# Patient Record
Sex: Female | Born: 1975
Health system: Southern US, Community
[De-identification: ages and names within clinical notes are randomized; demographics above are authoritative.]

## PROBLEM LIST (undated history)

## (undated) DIAGNOSIS — F17200 Nicotine dependence, unspecified, uncomplicated: Secondary | ICD-10-CM

## (undated) HISTORY — DX: Nicotine dependence, unspecified, uncomplicated: F17.200

---

## 1998-06-27 ENCOUNTER — Other Ambulatory Visit: Admission: RE | Admit: 1998-06-27 | Discharge: 1998-06-27 | Payer: Self-pay | Admitting: Obstetrics and Gynecology

## 1999-07-03 ENCOUNTER — Other Ambulatory Visit: Admission: RE | Admit: 1999-07-03 | Discharge: 1999-07-03 | Payer: Self-pay | Admitting: Gynecology

## 2000-03-26 ENCOUNTER — Encounter: Admission: RE | Admit: 2000-03-26 | Discharge: 2000-06-24 | Payer: Self-pay | Admitting: Gynecology

## 2000-04-22 ENCOUNTER — Ambulatory Visit (HOSPITAL_COMMUNITY): Admission: RE | Admit: 2000-04-22 | Discharge: 2000-04-22 | Payer: Self-pay | Admitting: *Deleted

## 2000-04-24 ENCOUNTER — Inpatient Hospital Stay (HOSPITAL_COMMUNITY): Admission: AD | Admit: 2000-04-24 | Discharge: 2000-04-27 | Payer: Self-pay | Admitting: *Deleted

## 2000-06-04 ENCOUNTER — Other Ambulatory Visit: Admission: RE | Admit: 2000-06-04 | Discharge: 2000-06-04 | Payer: Self-pay | Admitting: *Deleted

## 2001-06-09 ENCOUNTER — Other Ambulatory Visit: Admission: RE | Admit: 2001-06-09 | Discharge: 2001-06-09 | Payer: Self-pay | Admitting: *Deleted

## 2002-06-22 ENCOUNTER — Other Ambulatory Visit: Admission: RE | Admit: 2002-06-22 | Discharge: 2002-06-22 | Payer: Self-pay | Admitting: Gynecology

## 2003-07-07 ENCOUNTER — Other Ambulatory Visit: Admission: RE | Admit: 2003-07-07 | Discharge: 2003-07-07 | Payer: Self-pay | Admitting: Gynecology

## 2004-07-17 ENCOUNTER — Other Ambulatory Visit: Admission: RE | Admit: 2004-07-17 | Discharge: 2004-07-17 | Payer: Self-pay | Admitting: Gynecology

## 2005-08-20 ENCOUNTER — Other Ambulatory Visit: Admission: RE | Admit: 2005-08-20 | Discharge: 2005-08-20 | Payer: Self-pay | Admitting: Gynecology

## 2006-08-26 ENCOUNTER — Other Ambulatory Visit: Admission: RE | Admit: 2006-08-26 | Discharge: 2006-08-26 | Payer: Self-pay | Admitting: Gynecology

## 2007-02-10 ENCOUNTER — Encounter: Admission: RE | Admit: 2007-02-10 | Discharge: 2007-02-10 | Payer: Self-pay | Admitting: Endocrinology

## 2007-03-06 ENCOUNTER — Encounter (INDEPENDENT_AMBULATORY_CARE_PROVIDER_SITE_OTHER): Payer: Self-pay | Admitting: Interventional Radiology

## 2007-03-06 ENCOUNTER — Other Ambulatory Visit: Admission: RE | Admit: 2007-03-06 | Discharge: 2007-03-06 | Payer: Self-pay | Admitting: Interventional Radiology

## 2007-03-06 ENCOUNTER — Encounter: Admission: RE | Admit: 2007-03-06 | Discharge: 2007-03-06 | Payer: Self-pay | Admitting: Endocrinology

## 2007-09-01 ENCOUNTER — Other Ambulatory Visit: Admission: RE | Admit: 2007-09-01 | Discharge: 2007-09-01 | Payer: Self-pay | Admitting: Gynecology

## 2008-09-06 ENCOUNTER — Ambulatory Visit: Payer: Self-pay | Admitting: Women's Health

## 2008-09-06 ENCOUNTER — Other Ambulatory Visit: Admission: RE | Admit: 2008-09-06 | Discharge: 2008-09-06 | Payer: Self-pay | Admitting: Gynecology

## 2008-09-06 ENCOUNTER — Encounter: Payer: Self-pay | Admitting: Women's Health

## 2008-09-20 ENCOUNTER — Ambulatory Visit: Payer: Self-pay | Admitting: Gynecology

## 2008-10-25 ENCOUNTER — Ambulatory Visit: Payer: Self-pay | Admitting: Gynecology

## 2009-09-26 ENCOUNTER — Ambulatory Visit: Payer: Self-pay | Admitting: Women's Health

## 2009-09-26 ENCOUNTER — Other Ambulatory Visit: Admission: RE | Admit: 2009-09-26 | Discharge: 2009-09-26 | Payer: Self-pay | Admitting: Gynecology

## 2010-05-26 NOTE — Discharge Summary (Signed)
[facility]  Patient:    Jaime Klein, Jaime Klein                        MRN: [id number] Adm. Date:  [id number] Disc. Date: [id number] Attending:  Merrily Pew Dictator:   Antony Contras, N.P.                           Discharge Summary  DISCHARGE DIAGNOSES:          Intrauterine pregnancy at 37 weeks, gestational diabetes requiring insulin management.  PROCEDURE:                    Normal spontaneous vaginal delivery of viable infant over intact perineum with repair of bilateral labial tears.  HISTORY OF PRESENT ILLNESS:   Patient is a 35 year old prima gravida with an LMP of August 03, 1999, [facility] of May 10, 2000.  Prenatal risk factors include a negative rubella titer.  Patient also has gestational diabetes which required insulin management 8 units of insulin at bedtime.  PRENATAL LABORATORIES:        Blood type O+.  Antibody screen negative.  RPR, HBSAG, HIV nonreactive.  Rubella non-immune.  MSAFP normal.  HOSPITAL COURSE:              Patient was admitted for induction on April 25, 2000 at 37 weeks after an amniocentesis for lung maturity showed a positive PG.  Induction was initiated with Cytotec followed by Pitocin.  Patient also had a positive GBS culture and was given penicillin protocol.  Cervix on induction was 1-2 cm, 40%, -1/2 station.  Artificial rupture of membranes revealed clear fluid.  Patient progressed to complete dilatation and delivered an Apgar 9/9 8 pound 2 ounce female infant over intact perineum.  She did sustain bilateral labial tears which were repaired.  Postpartum she remained afebrile.  She had no difficulty voiding and was able to be discharged on her second postpartum day in satisfactory condition.  LABORATORIES:                 CBC:  Hematocrit 32.3, hemoglobin 11.2, WBC 14.2, platelets 264,000.  DISPOSITION:                  She was given rubella vaccine prior to discharge.  She was to continue with prenatal vitamins,  Motrin and Tylox for pain.  Follow-up in the office in six weeks for postpartum. DD:  05/20/00 TD:  05/20/00 Job: [id number]

## 2010-05-26 NOTE — H&P (Signed)
[facility]  Patient:    Jaime Klein, Jaime Klein                        MRN: [id number] Adm. Date:  [id number] Attending:  Merrily Pew                         History and Physical  CHIEF COMPLAINT:  A 37-week intrauterine pregnancy with mature amniocentesis.  HISTORY OF PRESENT ILLNESS:  The patient is a 35 year old GENERAL: at 44 and 3/7th weeks gestation who is induced for having Class A2 diabetes and a mature amniocentesis for lung maturity. The patient was diagnosed with diabetes during her pregnancy and started on 8 units of insulin at bedtime to control her sugars which have been well controlled. The patient has been getting weekly fluid checks and biweekly stress tests which have all been normal. The patient underwent an ultrasound for fetal growth at 37 weeks and noted to be in the 98th percentile and underwent an amniocentesis for lung maturity which was noted to have a positive PG. The patient was then induced with Cytotec and will have rupture of membranes with Pitocin.  PAST MEDICAL HISTORY:  None.  PAST SURGICAL HISTORY:  Wisdom teeth.  PAST OBSTETRICAL HISTORY:  None.  PAST GYN HISTORY:  The patient has had no abnormal Pap smears or STDs.  CURRENT MEDICATIONS: 1. Prenatal vitamins. 2. Insulin 8 units NPH at bedtime.  ALLERGIES:  SULFA.  SOCIAL HISTORY:  Denies tobacco, alcohol or drugs.  FAMILY HISTORY:  Without any epithelial cancers.  PRENATAL LABORATORY AND ACCESSORY DATA:  O positive, Rubella nonimmune, GBS positive.  PHYSICAL EXAMINATION:  VITAL SIGNS:  Blood pressure 122/70.  LUNGS:  Clear to auscultation bilaterally.  CARDIOVASCULAR:  Regular rate and rhythm.  ABDOMEN:  Gravid and nontender. Estimated fetal weight 7 pounds, 8 ounces.  EXTREMITIES:  Without cyanosis, clubbing, edema or tenderness.  PELVIC:  Cervical exam fingertip, thick and -2.  ASSESSMENT AND PLAN:  35 year old gravida 1 at  37-1/2th weeks with insulin dependent diabetes during pregnancy and mature amniocentesis. Induction with Cytotec and Pitocin. Patient will be started on penicillin for positive GBS and have Accu-Cheks on her sugars every 2 hours. DD:  04/25/00 TD:  04/25/00 Job: [id number]

## 2010-09-25 DIAGNOSIS — F17200 Nicotine dependence, unspecified, uncomplicated: Secondary | ICD-10-CM | POA: Insufficient documentation

## 2010-09-25 DIAGNOSIS — Z975 Presence of (intrauterine) contraceptive device: Secondary | ICD-10-CM | POA: Insufficient documentation

## 2010-10-02 ENCOUNTER — Encounter: Payer: Self-pay | Admitting: Women's Health

## 2010-10-09 ENCOUNTER — Other Ambulatory Visit (HOSPITAL_COMMUNITY)
Admission: RE | Admit: 2010-10-09 | Discharge: 2010-10-09 | Disposition: A | Discharge status: Home / Self Care | Payer: 59 | Source: Ambulatory Visit | Attending: Women's Health | Admitting: Women's Health

## 2010-10-09 ENCOUNTER — Ambulatory Visit (INDEPENDENT_AMBULATORY_CARE_PROVIDER_SITE_OTHER): Payer: 59 | Admitting: Women's Health

## 2010-10-09 ENCOUNTER — Encounter: Payer: Self-pay | Admitting: Women's Health

## 2010-10-09 VITALS — BP 130/70 | Ht 66.5 in | Wt 167.0 lb

## 2010-10-09 DIAGNOSIS — Z01419 Encounter for gynecological examination (general) (routine) without abnormal findings: Secondary | ICD-10-CM

## 2010-10-09 DIAGNOSIS — Z833 Family history of diabetes mellitus: Secondary | ICD-10-CM

## 2010-10-09 DIAGNOSIS — F329 Major depressive disorder, single episode, unspecified: Secondary | ICD-10-CM

## 2010-10-09 DIAGNOSIS — Z13220 Encounter for screening for lipoid disorders: Secondary | ICD-10-CM

## 2010-10-09 DIAGNOSIS — R823 Hemoglobinuria: Secondary | ICD-10-CM

## 2010-10-09 MED ORDER — FLUOXETINE HCL 20 MG PO CAPS
20.0000 mg | ORAL_CAPSULE | Freq: Every day | ORAL | Status: DC
Start: 2010-10-09 — End: 2011-10-31

## 2010-10-09 NOTE — Progress Notes (Signed)
Jaime Klein 1975-06-10 [id number]    History:    The patient presents for annual exam.  Roma Kayser, has a 35 year old Mammie Lorenzo, 62 year old adopted niece Ladona Ridgel, 53 old son Aggie Hacker. Also has a 42 year old stepson but does not live with her that is causing numerous problems i.e. drugs and arrests.   Past medical history, past surgical history, family history and social history were all reviewed and documented in the EPIC chart.   ROS:  A  ROS was performed and pertinent positives and negatives are included in the history.  Exam:  Filed Vitals:   10/09/10 1132  BP: 130/70    General appearance:  Normal Head/Neck:  Normal, without cervical or supraclavicular adenopathy. Thyroid:  Symmetrical, normal in size, without palpable masses or nodularity. Respiratory  Effort:  Normal  Auscultation:  Clear without wheezing or rhonchi Cardiovascular  Auscultation:  Regular rate, without rubs, murmurs or gallops  Edema/varicosities:  Not grossly evident Abdominal  Soft,nontender, without masses, guarding or rebound.  Liver/spleen:  No organomegaly noted  Hernia:  None appreciated  Skin  Inspection:  Grossly normal  Palpation:  Grossly normal Neurologic/psychiatric  Orientation:  Normal with appropriate conversation.  Mood/affect:  Normal  Genitourinary    Breasts: Examined lying and sitting.     Right: Without masses, retractions, discharge or axillary adenopathy.     Left: Without masses, retractions, discharge or axillary adenopathy.   Inguinal/mons:  Normal without inguinal adenopathy  External genitalia:  Normal  BUS/Urethra/Skene's glands:  Normal  Bladder:  Normal  Vagina:  Normal  Cervix:  Normal  Uterus:   normal in size, shape and contour.  Midline and mobile  Adnexa/parametria:     Rt: Without masses or tenderness.   Lt: Without masses or tenderness.  Anus and perineum: Normal  Digital rectal exam: Normal sphincter tone without palpated masses or  tenderness  Assessment/Plan:  35 y.o. [id number]  for annual exam Mirena IUD placed 9/10 with amenorrhea. Strings not visualized, ultrasound was done last year that  confirmed IUD in the uterus. No complaints. History of GDM.  Normal GYN exam Mirena IUD with amenorrhea  Plan: SBEs, exercise, calcium rich diet encouraged. CBC, glucose, lipid profile UA and Pap. Did review importance of no smoking, she quit in 2005, and states smokes a cigarette occasionally socially.   Harrington Challenger [facility], 12:56 PM 10/09/2010 .

## 2011-10-31 ENCOUNTER — Other Ambulatory Visit: Payer: Self-pay | Admitting: Women's Health

## 2011-11-05 ENCOUNTER — Encounter: Payer: Self-pay | Admitting: Women's Health

## 2011-11-05 ENCOUNTER — Ambulatory Visit (INDEPENDENT_AMBULATORY_CARE_PROVIDER_SITE_OTHER): Payer: 59 | Admitting: Women's Health

## 2011-11-05 VITALS — BP 112/76 | Ht 66.5 in | Wt 176.0 lb

## 2011-11-05 DIAGNOSIS — Z13220 Encounter for screening for lipoid disorders: Secondary | ICD-10-CM

## 2011-11-05 DIAGNOSIS — F419 Anxiety disorder, unspecified: Secondary | ICD-10-CM

## 2011-11-05 DIAGNOSIS — Z01419 Encounter for gynecological examination (general) (routine) without abnormal findings: Secondary | ICD-10-CM

## 2011-11-05 DIAGNOSIS — F411 Generalized anxiety disorder: Secondary | ICD-10-CM

## 2011-11-05 DIAGNOSIS — Z833 Family history of diabetes mellitus: Secondary | ICD-10-CM

## 2011-11-05 MED ORDER — FLUOXETINE HCL 20 MG PO CAPS: 20.0000 mg | ORAL_CAPSULE | Freq: Every day | ORAL | Status: DC

## 2011-11-05 NOTE — Progress Notes (Signed)
Jaime Klein 1975-08-28 [id number]    History:    The patient presents for annual exam.  Mirena IUD 09/2008 with rare bleeding. Smokes less than one half pack per day, Prozac 20 mg daily. History of normal Paps. Had labs taken at primary care, reports blood sugar was borderline.   Past medical history, past surgical history, family history and social history were all reviewed and documented in the EPIC chart. History of GDM. 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   ROS:  A  ROS was performed and pertinent positives and negatives are included in the history.  Exam:  Filed Vitals:   11/05/11 0944  BP: 112/76    General appearance:  Normal Head/Neck:  Normal, without cervical or supraclavicular adenopathy. Thyroid:  Symmetrical, normal in size, without palpable masses or nodularity. Respiratory  Effort:  Normal  Auscultation:  Clear without wheezing or rhonchi Cardiovascular  Auscultation:  Regular rate, without rubs, murmurs or gallops  Edema/varicosities:  Not grossly evident Abdominal  Soft,nontender, without masses, guarding or rebound.  Liver/spleen:  No organomegaly noted  Hernia:  None appreciated  Skin  Inspection:  Grossly normal  Palpation:  Grossly normal Neurologic/psychiatric  Orientation:  Normal with appropriate conversation.  Mood/affect:  Normal  Genitourinary    Breasts: Examined lying and sitting.     Right: Without masses, retractions, discharge or axillary adenopathy.     Left: Without masses, retractions, discharge or axillary adenopathy.   Inguinal/mons:  Normal without inguinal adenopathy  External genitalia:  Normal  BUS/Urethra/Skene's glands:  Normal  Bladder:  Normal  Vagina:  Normal  Cervix:  Normal IUD strings visible  Uterus:   normal in size, shape and contour.  Midline and mobile  Adnexa/parametria:     Rt: Without masses or tenderness.   Lt: Without masses or tenderness.  Anus and  perineum: Normal  Digital rectal exam: Normal sphincter tone without palpated masses or tenderness  Assessment/Plan:  36 y.o. M. WF G3 P1 for annual exam with no complaints.  Normal GYN exam Mirena IUD 09/2008 History of GDM with borderline blood sugar primary care 08/2011 Depression stable on Prozac 20 Smoker less than one half pack per day  Plan: Reviewed importance of continuing lifestyle changes of diet and exercise, followup with primary care as directed. SBE's, calcium rich diet, vitamin D 1000 daily, decreasing calories for weight loss encouraged. Normal Pap 2012, new screening guidelines reviewed. UA. Prozac 20 mg by mouth daily, prescription, proper use given and reviewed, counseling in the past and as needed. Requests to continue has done well on. Aware of hazards of smoking and plans to quit.   YOUNG,NANCY J WHNP, 1:20 PM 11/05/2011

## 2011-11-05 NOTE — Addendum Note (Signed)
Addended by: Harrington Challenger on: 11/05/2011 04:35 PM   Modules accepted: Level of Service

## 2011-11-05 NOTE — Patient Instructions (Signed)

## 2011-11-06 LAB — URINALYSIS W MICROSCOPIC + REFLEX CULTURE
Casts: NONE SEEN
Crystals: NONE SEEN
Ketones, ur: NEGATIVE mg/dL
Leukocytes, UA: NEGATIVE
Nitrite: NEGATIVE
Specific Gravity, Urine: 1.023 (ref 1.005–1.030)
Squamous Epithelial / LPF: NONE SEEN
pH: 6 (ref 5.0–8.0)

## 2011-12-11 ENCOUNTER — Telehealth: Payer: Self-pay | Admitting: *Deleted

## 2011-12-11 NOTE — Telephone Encounter (Signed)
[facility] called at [phone] requesting refill on prozac 20 mg cap. #30. rx called in .

## 2011-12-22 ENCOUNTER — Other Ambulatory Visit: Payer: Self-pay | Admitting: Women's Health

## 2012-02-23 ENCOUNTER — Other Ambulatory Visit: Payer: Self-pay

## 2012-11-10 ENCOUNTER — Encounter: Payer: 59 | Admitting: Women's Health

## 2012-11-12 ENCOUNTER — Encounter: Payer: Self-pay | Admitting: Women's Health

## 2012-11-13 ENCOUNTER — Other Ambulatory Visit: Payer: Self-pay

## 2012-12-03 ENCOUNTER — Encounter: Payer: Self-pay | Admitting: Women's Health

## 2012-12-25 ENCOUNTER — Encounter: Payer: Self-pay | Admitting: Women's Health

## 2013-01-06 ENCOUNTER — Other Ambulatory Visit: Payer: Self-pay | Admitting: Women's Health

## 2013-01-20 ENCOUNTER — Encounter: Payer: Self-pay | Admitting: Women's Health

## 2013-01-27 ENCOUNTER — Encounter: Payer: Self-pay | Admitting: Women's Health

## 2013-02-11 ENCOUNTER — Encounter: Payer: Self-pay | Admitting: Women's Health

## 2013-02-11 ENCOUNTER — Ambulatory Visit (INDEPENDENT_AMBULATORY_CARE_PROVIDER_SITE_OTHER): Payer: 59 | Admitting: Women's Health

## 2013-02-11 ENCOUNTER — Other Ambulatory Visit (HOSPITAL_COMMUNITY)
Admission: RE | Admit: 2013-02-11 | Discharge: 2013-02-11 | Disposition: A | Discharge status: Home / Self Care | Payer: 59 | Source: Ambulatory Visit | Attending: Gynecology | Admitting: Gynecology

## 2013-02-11 VITALS — BP 108/75 | Ht 66.0 in | Wt 178.0 lb

## 2013-02-11 DIAGNOSIS — Z01419 Encounter for gynecological examination (general) (routine) without abnormal findings: Secondary | ICD-10-CM

## 2013-02-11 DIAGNOSIS — F411 Generalized anxiety disorder: Secondary | ICD-10-CM

## 2013-02-11 MED ORDER — FLUOXETINE HCL 20 MG PO CAPS: ORAL_CAPSULE | ORAL | Status: DC

## 2013-02-11 NOTE — Addendum Note (Signed)
Addended by: Aura CampsWEBB, JENNIFER L on: 02/11/2013 11:28 AM   Modules accepted: Orders

## 2013-02-11 NOTE — Patient Instructions (Signed)

## 2013-02-11 NOTE — Progress Notes (Signed)
Jaime DadConnie Klein 08/11/1975 [id number]    History:    Presents for annual exam.  Amenorrheic with Mirena IUD placed 09/2008. Rare  one-day spotting. Normal Pap history. Smoker less than half pack per week. GDM with pregnancy. Doing well on Prozac 20 daily. Reports normal labs at primary care.  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 Mother hypertension. Jaime Klein.  ROS:  A  ROS was performed and pertinent positives and negatives are included.  Exam:  Filed Vitals:   02/11/13 1042  BP: 108/75    General appearance:  Normal Thyroid:  Symmetrical, normal in size, without palpable masses or nodularity. Respiratory  Auscultation:  Clear without wheezing or rhonchi Cardiovascular  Auscultation:  Regular rate, without rubs, murmurs or gallops  Edema/varicosities:  Not grossly evident Abdominal  Soft,nontender, without masses, guarding or rebound.  Liver/spleen:  No organomegaly noted  Hernia:  None appreciated  Skin  Inspection:  Grossly normal   Breasts: Examined lying and sitting.     Right: Without masses, retractions, discharge or axillary adenopathy.     Left: Without masses, retractions, discharge or axillary adenopathy. Gentitourinary   Inguinal/mons:  Normal without inguinal adenopathy  External genitalia:  Normal  BUS/Urethra/Skene's glands:  Normal  Vagina:  Normal  Cervix:  Normal, IUD string in os  Uterus: normal in size, shape and contour.  Midline and mobile  Adnexa/parametria:     Rt: Without masses or tenderness.   Lt: Without masses or tenderness.  Anus and perineum: Normal  Digital rectal exam: Normal sphincter tone without palpated masses or tenderness  Assessment/Plan:  38 y.o. MWF G1P1+1 adopted for annual exam with no complaints.  Mirena IUD 09/2008 Smoker Anxiety/depression stable on  Prozac   Plan: Schedule appointment August 2015 for Dr. Audie Klein to remove and replace Mirena IUD. SBE's, regular exercise, decrease calories for weight loss, calcium rich diet encouraged. Prozac 20 mg by mouth daily, prescription, proper use given and reviewed, denies need for counseling at this time. Aware of hazards of smoking is trying to completely quit. Pap, Pap normal 10/2010, new screening guidelines reviewed, HPV typing reviewed, declined. Keep scheduled followup with primary care for labs.   Jaime ChallengerYOUNG,Jaime Klein WHNP, 11:15 AM 02/11/2013

## 2013-10-16 ENCOUNTER — Other Ambulatory Visit: Payer: Self-pay | Admitting: Gynecology

## 2013-10-16 ENCOUNTER — Telehealth: Payer: Self-pay | Admitting: Gynecology

## 2013-10-16 DIAGNOSIS — Z30431 Encounter for routine checking of intrauterine contraceptive device: Secondary | ICD-10-CM

## 2013-10-16 MED ORDER — LEVONORGESTREL 20 MCG/24HR IU IUD: INTRAUTERINE_SYSTEM | Freq: Once | INTRAUTERINE | Status: AC

## 2013-10-16 NOTE — Telephone Encounter (Signed)
10/16/13-Pt [facility] removal of old Mirena and insertion of new at 100%, no copay. She already has appt scheduled with TF/wl

## 2013-11-09 ENCOUNTER — Encounter: Payer: Self-pay | Admitting: Gynecology

## 2013-11-09 ENCOUNTER — Ambulatory Visit (INDEPENDENT_AMBULATORY_CARE_PROVIDER_SITE_OTHER): Payer: 59 | Admitting: Gynecology

## 2013-11-09 DIAGNOSIS — Z30432 Encounter for removal of intrauterine contraceptive device: Secondary | ICD-10-CM

## 2013-11-09 DIAGNOSIS — Z30433 Encounter for removal and reinsertion of intrauterine contraceptive device: Secondary | ICD-10-CM

## 2013-11-09 DIAGNOSIS — Z30430 Encounter for insertion of intrauterine contraceptive device: Secondary | ICD-10-CM

## 2013-11-09 HISTORY — PX: INTRAUTERINE DEVICE INSERTION: SHX323

## 2013-11-09 NOTE — Patient Instructions (Signed)
Intrauterine Device Insertion Most often, an intrauterine device (IUD) is inserted into the uterus to prevent pregnancy. There are 2 types of IUDs available:  Copper IUD--This type of IUD creates an environment that is not favorable to sperm survival. The mechanism of action of the copper IUD is not known for certain. It can stay in place for 10 years.  Hormone IUD--This type of IUD contains the hormone progestin (synthetic progesterone). The progestin thickens the cervical mucus and prevents sperm from entering the uterus, and it also thins the uterine lining. There is no evidence that the hormone IUD prevents implantation. One hormone IUD can stay in place for up to 5 years, and a different hormone IUD can stay in place for up to 3 years. An IUD is the most cost-effective birth control if left in place for the full duration. It may be removed at any time. LET YOUR HEALTH CARE PROVIDER KNOW ABOUT:  Any allergies you have.  All medicines you are taking, including vitamins, herbs, eye drops, creams, and over-the-counter medicines.  Previous problems you or members of your family have had with the use of anesthetics.  Any blood disorders you have.  Previous surgeries you have had.  Possibility of pregnancy.  Medical conditions you have. RISKS AND COMPLICATIONS  Generally, intrauterine device insertion is a safe procedure. However, as with any procedure, complications can occur. Possible complications include:  Accidental puncture (perforation) of the uterus.  Accidental placement of the IUD either in the muscle layer of the uterus (myometrium) or outside the uterus. If this happens, the IUD can be found essentially floating around the bowels and must be taken out surgically.  The IUD may fall out of the uterus (expulsion). This is more common in women who have recently had a child.   Pregnancy in the fallopian tube (ectopic).  Pelvic inflammatory disease (PID), which is infection of  the uterus and fallopian tubes. The risk of PID is slightly increased in the first 20 days after the IUD is placed, but the overall risk is still very low. BEFORE THE PROCEDURE  Schedule the IUD insertion for when you will have your menstrual period or right after, to make sure you are not pregnant. Placement of the IUD is better tolerated shortly after a menstrual cycle.  You may need to take tests or be examined to make sure you are not pregnant.  You may be required to take a pregnancy test.  You may be required to get checked for sexually transmitted infections (STIs) prior to placement. Placing an IUD in someone who has an infection can make the infection worse.  You may be given a pain reliever to take 1 or 2 hours before the procedure.  An exam will be performed to determine the size and position of your uterus.  Ask your health care provider about changing or stopping your regular medicines. PROCEDURE   A tool (speculum) is placed in the vagina. This allows your health care provider to see the lower part of the uterus (cervix).  The cervix is prepped with a medicine that lowers the risk of infection.  You may be given a medicine to numb each side of the cervix (intracervical or paracervical block). This is used to block and control any discomfort with insertion.  A tool (uterine sound) is inserted into the uterus to determine the length of the uterine cavity and the direction the uterus may be tilted.  A slim instrument (IUD inserter) is inserted through the cervical   canal and into your uterus.  The IUD is placed in the uterine cavity and the insertion device is removed.  The nylon string that is attached to the IUD and used for eventual IUD removal is trimmed. It is trimmed so that it lays high in the vagina, just outside the cervix. AFTER THE PROCEDURE  You may have bleeding after the procedure. This is normal. It varies from light spotting for a few days to menstrual-like  bleeding.  You may have mild cramping. Document Released: 08/23/2010 Document Revised: 10/15/2012 Document Reviewed: 06/15/2012 ExitCare Patient Information 2015 [employer]. This information is not intended to replace advice given to you by your health care provider. Make sure you discuss any questions you have with your health care provider.  

## 2013-11-09 NOTE — Progress Notes (Signed)
Patient presents for Mirena IUD removal and replacement. Currently has a Mirena IUD at the five-year mark without complications and wants to have it replaced.  She is not having menses.  She has read through the booklet, has no contraindications and signed the consent form.  I reviewed the removal and insertional process with her as well as the risks to include infection, either immediate or long-term, uterine perforation or migration requiring surgery to remove, other complications such as pain, hormonal side effects and possibility of failure with subsequent pregnancy.   Exam with Kim assistant Pelvic: External BUS vagina normal. Cervix normal with IUD string visualized. Uterus anteverted the axial normal size shape contour midline mobile nontender. Adnexa without masses or tenderness.  Procedure: The cervix was visualized with a speculum and the IUD string was grasped with the [facility] forcep and the old Mirena IUD was removed, shown to the patient and discarded.  The cervix was then cleansed with Betadine, anterior lip grasped with a single-tooth tenaculum, the uterus was sounded and a Mirena IUD was placed according to manufacturer's recommendations without difficulty. The strings were trimmed. The patient tolerated well and will follow up in one month for a postinsertional check.  Lot number:  [id number]    Dara LordsFONTAINE,Ryshawn Sanzone P MD, 3:11 PM 11/09/2013

## 2013-11-10 ENCOUNTER — Encounter: Payer: Self-pay | Admitting: Gynecology

## 2013-12-07 ENCOUNTER — Ambulatory Visit (INDEPENDENT_AMBULATORY_CARE_PROVIDER_SITE_OTHER): Payer: 59 | Admitting: Gynecology

## 2013-12-07 ENCOUNTER — Encounter: Payer: Self-pay | Admitting: Gynecology

## 2013-12-07 DIAGNOSIS — Z30431 Encounter for routine checking of intrauterine contraceptive device: Secondary | ICD-10-CM

## 2013-12-07 NOTE — Patient Instructions (Signed)
Follow up in February 2016 for annual exam when you are due. Sooner if any issues.

## 2013-12-07 NOTE — Progress Notes (Signed)
Jaime DadConnie Klein 03/07/1975 [id number]        38 y.o.  G1P1 Presents for IUD checkup. Doing well without complaints.  Past medical history,surgical history, problem list, medications, allergies, family history and social history were all reviewed and documented in the EPIC chart.  Directed ROS with pertinent positives and negatives documented in the history of present illness/assessment and plan.  Exam: Kim assistant General appearance:  Normal External BUS vagina normal. Cervix normal with IUD string visualized in appropriate length. Uterus normal size midline mobile nontender. Adnexa without masses or tenderness.  Assessment/Plan:  38 y.o. G1P1 with normal IUD follow up exam. Follow up in February 2016 when due for annual exam, sooner if any issues.     Dara LordsFONTAINE,Kesley Gaffey P MD, 11:29 AM 12/07/2013

## 2014-02-16 ENCOUNTER — Other Ambulatory Visit: Payer: Self-pay | Admitting: Women's Health

## 2014-03-03 ENCOUNTER — Encounter: Payer: Self-pay | Admitting: Women's Health

## 2014-03-03 ENCOUNTER — Ambulatory Visit (INDEPENDENT_AMBULATORY_CARE_PROVIDER_SITE_OTHER): Payer: 59 | Admitting: Women's Health

## 2014-03-03 VITALS — BP 142/80 | Ht 67.0 in | Wt 190.0 lb

## 2014-03-03 DIAGNOSIS — Z01419 Encounter for gynecological examination (general) (routine) without abnormal findings: Secondary | ICD-10-CM

## 2014-03-03 DIAGNOSIS — Z13220 Encounter for screening for lipoid disorders: Secondary | ICD-10-CM

## 2014-03-03 DIAGNOSIS — N898 Other specified noninflammatory disorders of vagina: Secondary | ICD-10-CM

## 2014-03-03 DIAGNOSIS — Z975 Presence of (intrauterine) contraceptive device: Secondary | ICD-10-CM

## 2014-03-03 LAB — CBC WITH DIFFERENTIAL/PLATELET
BASOS ABS: 0 10*3/uL (ref 0.0–0.1)
BASOS PCT: 0 % (ref 0–1)
EOS ABS: 0.1 10*3/uL (ref 0.0–0.7)
Eosinophils Relative: 1 % (ref 0–5)
HEMATOCRIT: 43.3 % (ref 36.0–46.0)
HEMOGLOBIN: 14.9 g/dL (ref 12.0–15.0)
LYMPHS ABS: 2 10*3/uL (ref 0.7–4.0)
Lymphocytes Relative: 21 % (ref 12–46)
MCH: 30.4 pg (ref 26.0–34.0)
MCHC: 34.4 g/dL (ref 30.0–36.0)
MCV: 88.4 fL (ref 78.0–100.0)
MONO ABS: 0.8 10*3/uL (ref 0.1–1.0)
MONOS PCT: 8 % (ref 3–12)
MPV: 8.8 fL (ref 8.6–12.4)
Neutro Abs: 6.8 10*3/uL (ref 1.7–7.7)
Neutrophils Relative %: 70 % (ref 43–77)
Platelets: 334 10*3/uL (ref 150–400)
RBC: 4.9 MIL/uL (ref 3.87–5.11)
RDW: 13.6 % (ref 11.5–15.5)
WBC: 9.7 10*3/uL (ref 4.0–10.5)

## 2014-03-03 LAB — COMPREHENSIVE METABOLIC PANEL
ALBUMIN: 4.6 g/dL (ref 3.5–5.2)
ALK PHOS: 60 U/L (ref 39–117)
ALT: 13 U/L (ref 0–35)
AST: 13 U/L (ref 0–37)
BILIRUBIN TOTAL: 0.6 mg/dL (ref 0.2–1.2)
BUN: 10 mg/dL (ref 6–23)
CALCIUM: 9.4 mg/dL (ref 8.4–10.5)
CO2: 26 meq/L (ref 19–32)
Chloride: 102 mEq/L (ref 96–112)
Creat: 0.74 mg/dL (ref 0.50–1.10)
Glucose, Bld: 87 mg/dL (ref 70–99)
Potassium: 4.8 mEq/L (ref 3.5–5.3)
SODIUM: 138 meq/L (ref 135–145)
TOTAL PROTEIN: 7.6 g/dL (ref 6.0–8.3)

## 2014-03-03 LAB — LIPID PANEL
CHOLESTEROL: 160 mg/dL (ref 0–200)
HDL: 72 mg/dL (ref >=46)
LDL Cholesterol: 64 mg/dL (ref 0–99)
TRIGLYCERIDES: 120 mg/dL (ref <150)
Total CHOL/HDL Ratio: 2.2 Ratio
VLDL: 24 mg/dL (ref 0–40)

## 2014-03-03 LAB — WET PREP FOR TRICH, YEAST, CLUE
CLUE CELLS WET PREP: NONE SEEN
Trich, Wet Prep: NONE SEEN

## 2014-03-03 MED ORDER — FLUOXETINE HCL 20 MG PO CAPS: ORAL_CAPSULE | ORAL | Status: DC

## 2014-03-03 MED ORDER — FLUCONAZOLE 150 MG PO TABS: 150.0000 mg | ORAL_TABLET | Freq: Once | ORAL | Status: DC

## 2014-03-03 NOTE — Progress Notes (Addendum)
Jaime Klein 01/07/1976 [id number]    History:    Presents for annual exam.  Amenorrheic second Mirena IUD placed 11/2013. History of GDM. Smoker less than 5 cigarettes daily. Mother hypertension. Anxiety/depression stable on Prozac. Normal Pap history.  Past medical history, past surgical history, family history and social history were all reviewed and documented in the EPIC chart. 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 Husband farmer. Mother hypertension.  ROS:  A ROS was performed and pertinent positives and negatives are included.  Exam:  Filed Vitals:   03/03/14 1146  BP: 142/80    General appearance:  Normal Thyroid:  Symmetrical, normal in size, without palpable masses or nodularity. Respiratory  Auscultation:  Clear without wheezing or rhonchi Cardiovascular  Auscultation:  Regular rate, without rubs, murmurs or gallops  Edema/varicosities:  Not grossly evident Abdominal  Soft,nontender, without masses, guarding or rebound.  Liver/spleen:  No organomegaly noted  Hernia:  None appreciated  Skin  Inspection:  Grossly normal   Breasts: Examined lying and sitting.     Right: Without masses, retractions, discharge or axillary adenopathy.     Left: Without masses, retractions, discharge or axillary adenopathy. Gentitourinary   Inguinal/mons:  Normal without inguinal adenopathy  External genitalia:  Normal  BUS/Urethra/Skene's glands:  Normal  Vagina:  Minimal white discharge, wet prep positive for yeast  Cervix:  Normal IUD strings visible  Uterus:   normal in size, shape and contour.  Midline and mobile  Adnexa/parametria:     Rt: Without masses or tenderness.   Lt: Without masses or tenderness.  Anus and perineum: Normal  Digital rectal exam: Normal sphincter tone without palpated masses or tenderness  Assessment/Plan:  39 y.o. MWF G1P1 + 1 adopted for annual exam with complaint of mild vaginal itching.     Amenorrhea Mirena IUD 11/2013 Obesity Smoker Anxiety/depression stable on Prozac Yeast vaginitis  Plan: Reviewed importance of increasing regular exercise, decreasing calories for weight loss, has gained 12 pounds in the past year. SBE's, calcium rich diet, vitamin D 1000 daily encouraged. CBC, CMP, lipid panel, TSH, UA, Pap normal 2015, new screening guidelines reviewed. Prozac 20 mg by mouth daily prescription, proper use given and reviewed importance of leisure activities, exercise. Denies need for counseling at this time. Aware of hazards of smoking. Reviewed blood pressure slightly elevated instructed to check away from office if continues greater than 130/80 to follow-up with primary care. Diflucan 150 by mouth 1 dose, prescription, proper use given and reviewed instructed to call if no relief of symptoms.     Harrington ChallengerYOUNG,Delonta Yohannes J WHNP, 1:01 PM 03/03/2014

## 2014-03-03 NOTE — Patient Instructions (Signed)
Health Maintenance Adopting a healthy lifestyle and getting preventive care can go a long way to promote health and wellness. Talk with your health care provider about what schedule of regular examinations is right for you. This is a good chance for you to check in with your provider about disease prevention and staying healthy. In between checkups, there are plenty of things you can do on your own. Experts have done a lot of research about which lifestyle changes and preventive measures are most likely to keep you healthy. Ask your health care provider for more information. WEIGHT AND DIET  Eat a healthy diet  Be sure to include plenty of vegetables, fruits, low-fat dairy products, and lean protein.  Do not eat a lot of foods high in solid fats, added sugars, or salt.  Get regular exercise. This is one of the most important things you can do for your health.  Most adults should exercise for at least 150 minutes each week. The exercise should increase your heart rate and make you sweat (moderate-intensity exercise).  Most adults should also do strengthening exercises at least twice a week. This is in addition to the moderate-intensity exercise.  Maintain a healthy weight  Body mass index (BMI) is a measurement that can be used to identify possible weight problems. It estimates body fat based on height and weight. Your health care provider can help determine your BMI and help you achieve or maintain a healthy weight.  For females 25 years of age and older:   A BMI below 18.5 is considered underweight.  A BMI of 18.5 to 24.9 is normal.  A BMI of 25 to 29.9 is considered overweight.  A BMI of 30 and above is considered obese.  Watch levels of cholesterol and blood lipids  You should start having your blood tested for lipids and cholesterol at 39 years of age, then have this test every 5 years.  You may need to have your cholesterol levels checked more often if:  Your lipid or  cholesterol levels are high.  You are older than 39 years of age.  You are at high risk for heart disease.  CANCER SCREENING   Lung Cancer  Lung cancer screening is recommended for adults 97-92 years old who are at high risk for lung cancer because of a history of smoking.  A yearly low-dose CT scan of the lungs is recommended for people who:  Currently smoke.  Have quit within the past 15 years.  Have at least a 30-pack-year history of smoking. A pack year is smoking an average of one pack of cigarettes a day for 1 year.  Yearly screening should continue until it has been 15 years since you quit.  Yearly screening should stop if you develop a health problem that would prevent you from having lung cancer treatment.  Breast Cancer  Practice breast self-awareness. This means understanding how your breasts normally appear and feel.  It also means doing regular breast self-exams. Let your health care provider know about any changes, no matter how small.  If you are in your 20s or 30s, you should have a clinical breast exam (CBE) by a health care provider every 1-3 years as part of a regular health exam.  If you are 76 or older, have a CBE every year. Also consider having a breast X-ray (mammogram) every year.  If you have a family history of breast cancer, talk to your health care provider about genetic screening.  If you are  at high risk for breast cancer, talk to your health care provider about having an MRI and a mammogram every year.  Breast cancer gene (BRCA) assessment is recommended for women who have family members with BRCA-related cancers. BRCA-related cancers include:  Breast.  Ovarian.  Tubal.  Peritoneal cancers.  Results of the assessment will determine the need for genetic counseling and BRCA1 and BRCA2 testing. Cervical Cancer Routine pelvic examinations to screen for cervical cancer are no longer recommended for nonpregnant women who are considered low  risk for cancer of the pelvic organs (ovaries, uterus, and vagina) and who do not have symptoms. A pelvic examination may be necessary if you have symptoms including those associated with pelvic infections. Ask your health care provider if a screening pelvic exam is right for you.   The Pap test is the screening test for cervical cancer for women who are considered at risk.  If you had a hysterectomy for a problem that was not cancer or a condition that could lead to cancer, then you no longer need Pap tests.  If you are older than 65 years, and you have had normal Pap tests for the past 10 years, you no longer need to have Pap tests.  If you have had past treatment for cervical cancer or a condition that could lead to cancer, you need Pap tests and screening for cancer for at least 20 years after your treatment.  If you no longer get a Pap test, assess your risk factors if they change (such as having a new sexual partner). This can affect whether you should start being screened again.  Some women have medical problems that increase their chance of getting cervical cancer. If this is the case for you, your health care provider may recommend more frequent screening and Pap tests.  The human papillomavirus (HPV) test is another test that may be used for cervical cancer screening. The HPV test looks for the virus that can cause cell changes in the cervix. The cells collected during the Pap test can be tested for HPV.  The HPV test can be used to screen women 30 years of age and older. Getting tested for HPV can extend the interval between normal Pap tests from three to five years.  An HPV test also should be used to screen women of any age who have unclear Pap test results.  After 39 years of age, women should have HPV testing as often as Pap tests.  Colorectal Cancer  This type of cancer can be detected and often prevented.  Routine colorectal cancer screening usually begins at 39 years of  age and continues through 39 years of age.  Your health care provider may recommend screening at an earlier age if you have risk factors for colon cancer.  Your health care provider may also recommend using home test kits to check for hidden blood in the stool.  A small camera at the end of a tube can be used to examine your colon directly (sigmoidoscopy or colonoscopy). This is done to check for the earliest forms of colorectal cancer.  Routine screening usually begins at age 50.  Direct examination of the colon should be repeated every 5-10 years through 39 years of age. However, you may need to be screened more often if early forms of precancerous polyps or small growths are found. Skin Cancer  Check your skin from head to toe regularly.  Tell your health care provider about any new moles or changes in   moles, especially if there is a change in a mole's shape or color.  Also tell your health care provider if you have a mole that is larger than the size of a pencil eraser.  Always use sunscreen. Apply sunscreen liberally and repeatedly throughout the day.  Protect yourself by wearing long sleeves, pants, a wide-brimmed hat, and sunglasses whenever you are outside. HEART DISEASE, DIABETES, AND HIGH BLOOD PRESSURE   Have your blood pressure checked at least every 1-2 years. High blood pressure causes heart disease and increases the risk of stroke.  If you are between 75 years and 42 years old, ask your health care provider if you should take aspirin to prevent strokes.  Have regular diabetes screenings. This involves taking a blood sample to check your fasting blood sugar level.  If you are at a normal weight and have a low risk for diabetes, have this test once every three years after 39 years of age.  If you are overweight and have a high risk for diabetes, consider being tested at a younger age or more often. PREVENTING INFECTION  Hepatitis B  If you have a higher risk for  hepatitis B, you should be screened for this virus. You are considered at high risk for hepatitis B if:  You were born in a country where hepatitis B is common. Ask your health care provider which countries are considered high risk.  Your parents were born in a high-risk country, and you have not been immunized against hepatitis B (hepatitis B vaccine).  You have HIV or AIDS.  You use needles to inject street drugs.  You live with someone who has hepatitis B.  You have had sex with someone who has hepatitis B.  You get hemodialysis treatment.  You take certain medicines for conditions, including cancer, organ transplantation, and autoimmune conditions. Hepatitis C  Blood testing is recommended for:  Everyone born from [birth year] through 1965.  Anyone with known risk factors for hepatitis C. Sexually transmitted infections (STIs)  You should be screened for sexually transmitted infections (STIs) including gonorrhea and chlamydia if:  You are sexually active and are younger than 39 years of age.  You are older than 39 years of age and your health care provider tells you that you are at risk for this type of infection.  Your sexual activity has changed since you were last screened and you are at an increased risk for chlamydia or gonorrhea. Ask your health care provider if you are at risk.  If you do not have HIV, but are at risk, it may be recommended that you take a prescription medicine daily to prevent HIV infection. This is called pre-exposure prophylaxis (PrEP). You are considered at risk if:  You are sexually active and do not regularly use condoms or know the HIV status of your partner(s).  You take drugs by injection.  You are sexually active with a partner who has HIV. Talk with your health care provider about whether you are at high risk of being infected with HIV. If you choose to begin PrEP, you should first be tested for HIV. You should then be tested every 3 months for  as long as you are taking PrEP.  PREGNANCY   If you are premenopausal and you may become pregnant, ask your health care provider about preconception counseling.  If you may become pregnant, take 400 to 800 micrograms (mcg) of folic acid every day.  If you want to prevent pregnancy, talk to your  health care provider about birth control (contraception). OSTEOPOROSIS AND MENOPAUSE   Osteoporosis is a disease in which the bones lose minerals and strength with aging. This can result in serious bone fractures. Your risk for osteoporosis can be identified using a bone density scan.  If you are 65 years of age or older, or if you are at risk for osteoporosis and fractures, ask your health care provider if you should be screened.  Ask your health care provider whether you should take a calcium or vitamin D supplement to lower your risk for osteoporosis.  Menopause may have certain physical symptoms and risks.  Hormone replacement therapy may reduce some of these symptoms and risks. Talk to your health care provider about whether hormone replacement therapy is right for you.  HOME CARE INSTRUCTIONS   Schedule regular health, dental, and eye exams.  Stay current with your immunizations.   Do not use any tobacco products including cigarettes, chewing tobacco, or electronic cigarettes.  If you are pregnant, do not drink alcohol.  If you are breastfeeding, limit how much and how often you drink alcohol.  Limit alcohol intake to no more than 1 drink per day for nonpregnant women. One drink equals 12 ounces of beer, 5 ounces of wine, or 1 ounces of hard liquor.  Do not use street drugs.  Do not share needles.  Ask your health care provider for help if you need support or information about quitting drugs.  Tell your health care provider if you often feel depressed.  Tell your health care provider if you have ever been abused or do not feel safe at home. Document Released: 07/10/2010  Document Revised: 05/11/2013 Document Reviewed: 11/26/2012 [employer] Patient Information 2015 [employer]. This information is not intended to replace advice given to you by your health care provider. Make sure you discuss any questions you have with your health care provider. Exercise to Stay Healthy Exercise helps you become and stay healthy. EXERCISE IDEAS AND TIPS Choose exercises that:  You enjoy.  Fit into your day. You do not need to exercise really hard to be healthy. You can do exercises at a slow or medium level and stay healthy. You can:  Stretch before and after working out.  Try yoga, Pilates, or tai chi.  Lift weights.  Walk fast, swim, jog, run, climb stairs, bicycle, dance, or rollerskate.  Take aerobic classes. Exercises that burn about 150 calories:  Running 1  miles in 15 minutes.  Playing volleyball for 45 to 60 minutes.  Washing and waxing a car for 45 to 60 minutes.  Playing touch football for 45 minutes.  Walking 1  miles in 35 minutes.  Pushing a stroller 1  miles in 30 minutes.  Playing basketball for 30 minutes.  Raking leaves for 30 minutes.  Bicycling 5 miles in 30 minutes.  Walking 2 miles in 30 minutes.  Dancing for 30 minutes.  Shoveling snow for 15 minutes.  Swimming laps for 20 minutes.  Walking up stairs for 15 minutes.  Bicycling 4 miles in 15 minutes.  Gardening for 30 to 45 minutes.  Jumping rope for 15 minutes.  Washing windows or floors for 45 to 60 minutes. Document Released: 01/27/2010 Document Revised: 03/19/2011 Document Reviewed: 01/27/2010 [employer] Patient Information 2015 [employer]. This information is not intended to replace advice given to you by your health care provider. Make sure you discuss any questions you have with your health care provider.  

## 2014-03-04 ENCOUNTER — Encounter: Payer: Self-pay | Admitting: Women's Health

## 2014-03-04 LAB — URINALYSIS W MICROSCOPIC + REFLEX CULTURE
Bilirubin Urine: NEGATIVE
Casts: NONE SEEN
Crystals: NONE SEEN
Glucose, UA: NEGATIVE mg/dL
Ketones, ur: NEGATIVE mg/dL
LEUKOCYTES UA: NEGATIVE
Nitrite: NEGATIVE
PROTEIN: NEGATIVE mg/dL
SQUAMOUS EPITHELIAL / LPF: NONE SEEN
Specific Gravity, Urine: 1.006 (ref 1.005–1.030)
UROBILINOGEN UA: 0.2 mg/dL (ref 0.0–1.0)
pH: 5.5 (ref 5.0–8.0)

## 2015-03-09 ENCOUNTER — Encounter: Payer: 59 | Admitting: Women's Health

## 2015-03-30 ENCOUNTER — Encounter: Payer: Self-pay | Admitting: Women's Health

## 2015-03-30 ENCOUNTER — Ambulatory Visit (INDEPENDENT_AMBULATORY_CARE_PROVIDER_SITE_OTHER): Payer: 59 | Admitting: Women's Health

## 2015-03-30 VITALS — BP 132/86 | Ht 66.0 in | Wt 193.0 lb

## 2015-03-30 DIAGNOSIS — F4322 Adjustment disorder with anxiety: Secondary | ICD-10-CM

## 2015-03-30 DIAGNOSIS — Z01419 Encounter for gynecological examination (general) (routine) without abnormal findings: Secondary | ICD-10-CM

## 2015-03-30 DIAGNOSIS — Z720 Tobacco use: Secondary | ICD-10-CM | POA: Diagnosis not present

## 2015-03-30 DIAGNOSIS — F17200 Nicotine dependence, unspecified, uncomplicated: Secondary | ICD-10-CM

## 2015-03-30 MED ORDER — FLUOXETINE HCL 20 MG PO CAPS: ORAL_CAPSULE | ORAL | Status: DC

## 2015-03-30 NOTE — Patient Instructions (Signed)
[facility]: [place] Maintenance, Female Adopting a healthy lifestyle and getting preventive care can go a long way to promote health and wellness. Talk with your health care provider about what schedule of regular examinations is right for you. This is a good chance for you to check in with your provider about disease prevention and staying healthy. In between checkups, there are plenty of things you can do on your own. Experts have done a lot of research about which lifestyle changes and preventive measures are most likely to keep you healthy. Ask your health care provider for more information. WEIGHT AND DIET  Eat a healthy diet  Be sure to include plenty of vegetables, fruits, low-fat dairy products, and lean protein.  Do not eat a lot of foods high in solid fats, added sugars, or salt.  Get regular exercise. This is one of the most important things you can do for your health.  Most adults should exercise for at least 150 minutes each week. The exercise should increase your heart rate and make you sweat (moderate-intensity exercise).  Most adults should also do strengthening exercises at least twice a week. This is in addition to the moderate-intensity exercise.  Maintain a healthy weight  Body mass index (BMI) is a measurement that can be used to identify possible weight problems. It estimates body fat based on height and weight. Your health care provider can help determine your BMI and help you achieve or maintain a healthy weight.  For females 1 years of age and older:   A BMI below 18.5 is considered underweight.  A BMI of 18.5 to 24.9 is normal.  A BMI of 25 to 29.9 is considered overweight.  A BMI of 30 and above is considered obese.  Watch levels of cholesterol and blood lipids  You should start having your blood tested for lipids and cholesterol at 40 years of age, then have this test every 5 years.  You may need to have your cholesterol levels checked  more often if:  Your lipid or cholesterol levels are high.  You are older than 40 years of age.  You are at high risk for heart disease.  CANCER SCREENING   Lung Cancer  Lung cancer screening is recommended for adults 93-27 years old who are at high risk for lung cancer because of a history of smoking.  A yearly low-dose CT scan of the lungs is recommended for people who:  Currently smoke.  Have quit within the past 15 years.  Have at least a 30-pack-year history of smoking. A pack year is smoking an average of one pack of cigarettes a day for 1 year.  Yearly screening should continue until it has been 15 years since you quit.  Yearly screening should stop if you develop a health problem that would prevent you from having lung cancer treatment.  Breast Cancer  Practice breast self-awareness. This means understanding how your breasts normally appear and feel.  It also means doing regular breast self-exams. Let your health care provider know about any changes, no matter how small.  If you are in your 20s or 30s, you should have a clinical breast exam (CBE) by a health care provider every 1-3 years as part of a regular health exam.  If you are 78 or older, have a CBE every year. Also consider having a breast X-ray (mammogram) every year.  If you have a family history of breast cancer, talk to your health care provider about genetic  screening.  If you are at high risk for breast cancer, talk to your health care provider about having an MRI and a mammogram every year.  Breast cancer gene (BRCA) assessment is recommended for women who have family members with BRCA-related cancers. BRCA-related cancers include:  Breast.  Ovarian.  Tubal.  Peritoneal cancers.  Results of the assessment will determine the need for genetic counseling and BRCA1 and BRCA2 testing. Cervical Cancer Your health care provider may recommend that you be screened regularly for cancer of the pelvic  organs (ovaries, uterus, and vagina). This screening involves a pelvic examination, including checking for microscopic changes to the surface of your cervix (Pap test). You may be encouraged to have this screening done every 3 years, beginning at age 72.  For women ages 62-65, health care providers may recommend pelvic exams and Pap testing every 3 years, or they may recommend the Pap and pelvic exam, combined with testing for human papilloma virus (HPV), every 5 years. Some types of HPV increase your risk of cervical cancer. Testing for HPV may also be done on women of any age with unclear Pap test results.  Other health care providers may not recommend any screening for nonpregnant women who are considered low risk for pelvic cancer and who do not have symptoms. Ask your health care provider if a screening pelvic exam is right for you.  If you have had past treatment for cervical cancer or a condition that could lead to cancer, you need Pap tests and screening for cancer for at least 20 years after your treatment. If Pap tests have been discontinued, your risk factors (such as having a new sexual partner) need to be reassessed to determine if screening should resume. Some women have medical problems that increase the chance of getting cervical cancer. In these cases, your health care provider may recommend more frequent screening and Pap tests. Colorectal Cancer  This type of cancer can be detected and often prevented.  Routine colorectal cancer screening usually begins at 40 years of age and continues through 40 years of age.  Your health care provider may recommend screening at an earlier age if you have risk factors for colon cancer.  Your health care provider may also recommend using home test kits to check for hidden blood in the stool.  A small camera at the end of a tube can be used to examine your colon directly (sigmoidoscopy or colonoscopy). This is done to check for the earliest forms  of colorectal cancer.  Routine screening usually begins at age 25.  Direct examination of the colon should be repeated every 5-10 years through 40 years of age. However, you may need to be screened more often if early forms of precancerous polyps or small growths are found. Skin Cancer  Check your skin from head to toe regularly.  Tell your health care provider about any new moles or changes in moles, especially if there is a change in a mole's shape or color.  Also tell your health care provider if you have a mole that is larger than the size of a pencil eraser.  Always use sunscreen. Apply sunscreen liberally and repeatedly throughout the day.  Protect yourself by wearing long sleeves, pants, a wide-brimmed hat, and sunglasses whenever you are outside. HEART DISEASE, DIABETES, AND HIGH BLOOD PRESSURE   High blood pressure causes heart disease and increases the risk of stroke. High blood pressure is more likely to develop in:  People who have blood pressure  in the high end of the normal range (130-139/85-89 mm Hg).  People who are overweight or obese.  People who are African American.  If you are 13-70 years of age, have your blood pressure checked every 3-5 years. If you are 55 years of age or older, have your blood pressure checked every year. You should have your blood pressure measured twice--once when you are at a hospital or clinic, and once when you are not at a hospital or clinic. Record the average of the two measurements. To check your blood pressure when you are not at a hospital or clinic, you can use:  An automated blood pressure machine at a pharmacy.  A home blood pressure monitor.  If you are between 39 years and 36 years old, ask your health care provider if you should take aspirin to prevent strokes.  Have regular diabetes screenings. This involves taking a blood sample to check your fasting blood sugar level.  If you are at a normal weight and have a low risk  for diabetes, have this test once every three years after 40 years of age.  If you are overweight and have a high risk for diabetes, consider being tested at a younger age or more often. PREVENTING INFECTION  Hepatitis B  If you have a higher risk for hepatitis B, you should be screened for this virus. You are considered at high risk for hepatitis B if:  You were born in a country where hepatitis B is common. Ask your health care provider which countries are considered high risk.  Your parents were born in a high-risk country, and you have not been immunized against hepatitis B (hepatitis B vaccine).  You have HIV or AIDS.  You use needles to inject street drugs.  You live with someone who has hepatitis B.  You have had sex with someone who has hepatitis B.  You get hemodialysis treatment.  You take certain medicines for conditions, including cancer, organ transplantation, and autoimmune conditions. Hepatitis C  Blood testing is recommended for:  Everyone born from [birth year] through 1965.  Anyone with known risk factors for hepatitis C. Sexually transmitted infections (STIs)  You should be screened for sexually transmitted infections (STIs) including gonorrhea and chlamydia if:  You are sexually active and are younger than 39 years of age.  You are older than 40 years of age and your health care provider tells you that you are at risk for this type of infection.  Your sexual activity has changed since you were last screened and you are at an increased risk for chlamydia or gonorrhea. Ask your health care provider if you are at risk.  If you do not have HIV, but are at risk, it may be recommended that you take a prescription medicine daily to prevent HIV infection. This is called pre-exposure prophylaxis (PrEP). You are considered at risk if:  You are sexually active and do not regularly use condoms or know the HIV status of your partner(s).  You take drugs by injection.  You  are sexually active with a partner who has HIV. Talk with your health care provider about whether you are at high risk of being infected with HIV. If you choose to begin PrEP, you should first be tested for HIV. You should then be tested every 3 months for as long as you are taking PrEP.  PREGNANCY   If you are premenopausal and you may become pregnant, ask your health care provider about preconception counseling.  If you may become pregnant, take 400 to 800 micrograms (mcg) of folic acid every day.  If you want to prevent pregnancy, talk to your health care provider about birth control (contraception). OSTEOPOROSIS AND MENOPAUSE   Osteoporosis is a disease in which the bones lose minerals and strength with aging. This can result in serious bone fractures. Your risk for osteoporosis can be identified using a bone density scan.  If you are 51 years of age or older, or if you are at risk for osteoporosis and fractures, ask your health care provider if you should be screened.  Ask your health care provider whether you should take a calcium or vitamin D supplement to lower your risk for osteoporosis.  Menopause may have certain physical symptoms and risks.  Hormone replacement therapy may reduce some of these symptoms and risks. Talk to your health care provider about whether hormone replacement therapy is right for you.  HOME CARE INSTRUCTIONS   Schedule regular health, dental, and eye exams.  Stay current with your immunizations.   Do not use any tobacco products including cigarettes, chewing tobacco, or electronic cigarettes.  If you are pregnant, do not drink alcohol.  If you are breastfeeding, limit how much and how often you drink alcohol.  Limit alcohol intake to no more than 1 drink per day for nonpregnant women. One drink equals 12 ounces of beer, 5 ounces of wine, or 1 ounces of hard liquor.  Do not use street drugs.  Do not share needles.  Ask your health care provider  for help if you need support or information about quitting drugs.  Tell your health care provider if you often feel depressed.  Tell your health care provider if you have ever been abused or do not feel safe at home.   This information is not intended to replace advice given to you by your health care provider. Make sure you discuss any questions you have with your health care provider.   Document Released: 07/10/2010 Document Revised: 01/15/2014 Document Reviewed: 11/26/2012 Elsevier Interactive Patient Education [employer].

## 2015-03-30 NOTE — Progress Notes (Signed)
Mathis DadConnie Dall 07/13/1975 [id number]    History:    Presents for annual exam.  2nd Mirena IUD placed 11/2013 /amenorrheic/ married. Normal Pap history. History of GDM. Anxiety/depression stable on Prozac. Smokes roughly one pack per month.  Past medical history, past surgical history, family history and social history were all reviewed and documented in the EPIC chart. Hairdresser. Adopted nieceTaylor age 40, son [name] age 40, stepson [id number], helps care for 40 year old niece. Mother with hypertension.  ROS:  A ROS was performed and pertinent positives and negatives are included.  Exam:  Filed Vitals:   03/30/15 0902  BP: 132/86    General appearance:  Normal Thyroid:  Symmetrical, normal in size, without palpable masses or nodularity. Respiratory  Auscultation:  Clear without wheezing or rhonchi Cardiovascular  Auscultation:  Regular rate, without rubs, murmurs or gallops  Edema/varicosities:  Not grossly evident Abdominal  Soft,nontender, without masses, guarding or rebound.  Liver/spleen:  No organomegaly noted  Hernia:  None appreciated  Skin  Inspection:  Grossly normal   Breasts: Examined lying and sitting.     Right: Without masses, retractions, discharge or axillary adenopathy.     Left: Without masses, retractions, discharge or axillary adenopathy. Gentitourinary   Inguinal/mons:  Normal without inguinal adenopathy  External genitalia:  Normal  BUS/Urethra/Skene's glands:  Normal  Vagina:  Normal  Cervix:  Normal, IUD strings visible  Uterus: Normal in size, shape and contour.  Midline and mobile  Adnexa/parametria:     Rt: Without masses or tenderness.   Lt: Without masses or tenderness.  Anus and perineum: Normal  Digital rectal exam: Normal sphincter tone without palpated masses or tenderness  Assessment/Plan:  40 y.o. MWF G1 P1 events for annual exam without complaints.  Amenorrhea with Mirena IUD placed 11/2013 Obesity Smoker-has cut down to 1  pack/month Anxiety/depression stable on Prozac  Plan: SBE's, start annual screening mammogram at 40-breast center information given, increase exercise and decrease calories for weight loss, MVI daily encouraged. Discussed choices for healthy diet and living, 15 pound weight gain over past 2 years. Aware of hazards of smoking. CBC, CMP, Pap with HR HPV, new screening screening guidelines reviewed.  Harrington ChallengerYOUNG,NANCY J Parsons State HospitalWHNP, 9:48 AM 03/30/2015

## 2015-04-01 ENCOUNTER — Encounter: Payer: Self-pay | Admitting: Women's Health

## 2015-04-01 LAB — PAP, TP IMAGING W/ HPV RNA, RFLX HPV TYPE 16,18/45: HPV mRNA, High Risk: NOT DETECTED

## 2016-02-28 ENCOUNTER — Other Ambulatory Visit: Payer: Self-pay | Admitting: Women's Health

## 2016-02-28 DIAGNOSIS — Z1231 Encounter for screening mammogram for malignant neoplasm of breast: Secondary | ICD-10-CM

## 2016-03-26 ENCOUNTER — Ambulatory Visit: Payer: Self-pay

## 2016-04-16 ENCOUNTER — Ambulatory Visit (INDEPENDENT_AMBULATORY_CARE_PROVIDER_SITE_OTHER): Payer: 59 | Admitting: Women's Health

## 2016-04-16 ENCOUNTER — Ambulatory Visit
Admission: RE | Admit: 2016-04-16 | Discharge: 2016-04-16 | Disposition: A | Discharge status: Home / Self Care | Payer: 59 | Source: Ambulatory Visit | Attending: Women's Health | Admitting: Women's Health

## 2016-04-16 ENCOUNTER — Other Ambulatory Visit: Payer: Self-pay | Admitting: Women's Health

## 2016-04-16 ENCOUNTER — Encounter: Payer: Self-pay | Admitting: Women's Health

## 2016-04-16 VITALS — BP 124/86 | Ht 66.0 in | Wt 204.0 lb

## 2016-04-16 DIAGNOSIS — F4322 Adjustment disorder with anxiety: Secondary | ICD-10-CM | POA: Diagnosis not present

## 2016-04-16 DIAGNOSIS — Z1231 Encounter for screening mammogram for malignant neoplasm of breast: Secondary | ICD-10-CM

## 2016-04-16 DIAGNOSIS — Z01419 Encounter for gynecological examination (general) (routine) without abnormal findings: Secondary | ICD-10-CM | POA: Diagnosis not present

## 2016-04-16 DIAGNOSIS — Z13220 Encounter for screening for lipoid disorders: Secondary | ICD-10-CM

## 2016-04-16 LAB — CBC WITH DIFFERENTIAL/PLATELET
BASOS ABS: 0 {cells}/uL (ref 0–200)
Basophils Relative: 0 %
EOS ABS: 144 {cells}/uL (ref 15–500)
Eosinophils Relative: 2 %
HCT: 44 % (ref 35.0–45.0)
Hemoglobin: 14.4 g/dL (ref 11.7–15.5)
LYMPHS PCT: 24 %
Lymphs Abs: 1728 cells/uL (ref 850–3900)
MCH: 29.6 pg (ref 27.0–33.0)
MCHC: 32.7 g/dL (ref 32.0–36.0)
MCV: 90.5 fL (ref 80.0–100.0)
MONOS PCT: 10 %
MPV: 9.3 fL (ref 7.5–12.5)
Monocytes Absolute: 720 cells/uL (ref 200–950)
NEUTROS PCT: 64 %
Neutro Abs: 4608 cells/uL (ref 1500–7800)
PLATELETS: 283 10*3/uL (ref 140–400)
RBC: 4.86 MIL/uL (ref 3.80–5.10)
RDW: 13.4 % (ref 11.0–15.0)
WBC: 7.2 10*3/uL (ref 3.8–10.8)

## 2016-04-16 LAB — LIPID PANEL
CHOL/HDL RATIO: 3.1 ratio (ref <5.0)
CHOLESTEROL: 205 mg/dL — AB (ref <200)
HDL: 67 mg/dL (ref >50)
LDL Cholesterol: 105 mg/dL — ABNORMAL HIGH (ref <100)
Triglycerides: 164 mg/dL — ABNORMAL HIGH (ref <150)
VLDL: 33 mg/dL — AB (ref <30)

## 2016-04-16 LAB — COMPREHENSIVE METABOLIC PANEL
ALT: 54 U/L — ABNORMAL HIGH (ref 6–29)
AST: 36 U/L — ABNORMAL HIGH (ref 10–30)
Albumin: 4.7 g/dL (ref 3.6–5.1)
Alkaline Phosphatase: 61 U/L (ref 33–115)
BUN: 15 mg/dL (ref 7–25)
CHLORIDE: 106 mmol/L (ref 98–110)
CO2: 24 mmol/L (ref 20–31)
Calcium: 9.6 mg/dL (ref 8.6–10.2)
Creat: 0.7 mg/dL (ref 0.50–1.10)
GLUCOSE: 119 mg/dL — AB (ref 65–99)
POTASSIUM: 4.1 mmol/L (ref 3.5–5.3)
Sodium: 140 mmol/L (ref 135–146)
Total Bilirubin: 0.6 mg/dL (ref 0.2–1.2)
Total Protein: 8.1 g/dL (ref 6.1–8.1)

## 2016-04-16 MED ORDER — FLUOXETINE HCL 20 MG PO CAPS: ORAL_CAPSULE | ORAL | 4 refills | Status: DC

## 2016-04-16 NOTE — Patient Instructions (Signed)
Health Maintenance, Female Adopting a healthy lifestyle and getting preventive care can go a long way to promote health and wellness. Talk with your health care provider about what schedule of regular examinations is right for you. This is a good chance for you to check in with your provider about disease prevention and staying healthy. In between checkups, there are plenty of things you can do on your own. Experts have done a lot of research about which lifestyle changes and preventive measures are most likely to keep you healthy. Ask your health care provider for more information. Weight and diet Eat a healthy diet  Be sure to include plenty of vegetables, fruits, low-fat dairy products, and lean protein.  Do not eat a lot of foods high in solid fats, added sugars, or salt.  Get regular exercise. This is one of the most important things you can do for your health.  Most adults should exercise for at least 150 minutes each week. The exercise should increase your heart rate and make you sweat (moderate-intensity exercise).  Most adults should also do strengthening exercises at least twice a week. This is in addition to the moderate-intensity exercise. Maintain a healthy weight  Body mass index (BMI) is a measurement that can be used to identify possible weight problems. It estimates body fat based on height and weight. Your health care provider can help determine your BMI and help you achieve or maintain a healthy weight.  For females 76 years of age and older:  A BMI below 18.5 is considered underweight.  A BMI of 18.5 to 24.9 is normal.  A BMI of 25 to 29.9 is considered overweight.  A BMI of 30 and above is considered obese. Watch levels of cholesterol and blood lipids  You should start having your blood tested for lipids and cholesterol at 41 years of age, then have this test every 5 years.  You may need to have your cholesterol levels checked more often if:  Your lipid or  cholesterol levels are high.  You are older than 41 years of age.  You are at high risk for heart disease. Cancer screening Lung Cancer  Lung cancer screening is recommended for adults 64-42 years old who are at high risk for lung cancer because of a history of smoking.  A yearly low-dose CT scan of the lungs is recommended for people who:  Currently smoke.  Have quit within the past 15 years.  Have at least a 30-pack-year history of smoking. A pack year is smoking an average of one pack of cigarettes a day for 1 year.  Yearly screening should continue until it has been 15 years since you quit.  Yearly screening should stop if you develop a health problem that would prevent you from having lung cancer treatment. Breast Cancer  Practice breast self-awareness. This means understanding how your breasts normally appear and feel.  It also means doing regular breast self-exams. Let your health care provider know about any changes, no matter how small.  If you are in your 20s or 30s, you should have a clinical breast exam (CBE) by a health care provider every 1-3 years as part of a regular health exam.  If you are 34 or older, have a CBE every year. Also consider having a breast X-ray (mammogram) every year.  If you have a family history of breast cancer, talk to your health care provider about genetic screening.  If you are at high risk for breast cancer, talk  to your health care provider about having an MRI and a mammogram every year.  Breast cancer gene (BRCA) assessment is recommended for women who have family members with BRCA-related cancers. BRCA-related cancers include:  Breast.  Ovarian.  Tubal.  Peritoneal cancers.  Results of the assessment will determine the need for genetic counseling and BRCA1 and BRCA2 testing. Cervical Cancer  Your health care provider may recommend that you be screened regularly for cancer of the pelvic organs (ovaries, uterus, and vagina).  This screening involves a pelvic examination, including checking for microscopic changes to the surface of your cervix (Pap test). You may be encouraged to have this screening done every 3 years, beginning at age 24.  For women ages 66-65, health care providers may recommend pelvic exams and Pap testing every 3 years, or they may recommend the Pap and pelvic exam, combined with testing for human papilloma virus (HPV), every 5 years. Some types of HPV increase your risk of cervical cancer. Testing for HPV may also be done on women of any age with unclear Pap test results.  Other health care providers may not recommend any screening for nonpregnant women who are considered low risk for pelvic cancer and who do not have symptoms. Ask your health care provider if a screening pelvic exam is right for you.  If you have had past treatment for cervical cancer or a condition that could lead to cancer, you need Pap tests and screening for cancer for at least 20 years after your treatment. If Pap tests have been discontinued, your risk factors (such as having a new sexual partner) need to be reassessed to determine if screening should resume. Some women have medical problems that increase the chance of getting cervical cancer. In these cases, your health care provider may recommend more frequent screening and Pap tests. Colorectal Cancer  This type of cancer can be detected and often prevented.  Routine colorectal cancer screening usually begins at 41 years of age and continues through 41 years of age.  Your health care provider may recommend screening at an earlier age if you have risk factors for colon cancer.  Your health care provider may also recommend using home test kits to check for hidden blood in the stool.  A small camera at the end of a tube can be used to examine your colon directly (sigmoidoscopy or colonoscopy). This is done to check for the earliest forms of colorectal cancer.  Routine  screening usually begins at age 41.  Direct examination of the colon should be repeated every 5-10 years through 41 years of age. However, you may need to be screened more often if early forms of precancerous polyps or small growths are found. Skin Cancer  Check your skin from head to toe regularly.  Tell your health care provider about any new moles or changes in moles, especially if there is a change in a mole's shape or color.  Also tell your health care provider if you have a mole that is larger than the size of a pencil eraser.  Always use sunscreen. Apply sunscreen liberally and repeatedly throughout the day.  Protect yourself by wearing long sleeves, pants, a wide-brimmed hat, and sunglasses whenever you are outside. Heart disease, diabetes, and high blood pressure  High blood pressure causes heart disease and increases the risk of stroke. High blood pressure is more likely to develop in:  People who have blood pressure in the high end of the normal range (130-139/85-89 mm Hg).  People who are overweight or obese.  People who are African American.  If you are 59-24 years of age, have your blood pressure checked every 3-5 years. If you are 34 years of age or older, have your blood pressure checked every year. You should have your blood pressure measured twice-once when you are at a hospital or clinic, and once when you are not at a hospital or clinic. Record the average of the two measurements. To check your blood pressure when you are not at a hospital or clinic, you can use:  An automated blood pressure machine at a pharmacy.  A home blood pressure monitor.  If you are between 29 years and 60 years old, ask your health care provider if you should take aspirin to prevent strokes.  Have regular diabetes screenings. This involves taking a blood sample to check your fasting blood sugar level.  If you are at a normal weight and have a low risk for diabetes, have this test once  every three years after 41 years of age.  If you are overweight and have a high risk for diabetes, consider being tested at a younger age or more often. Preventing infection Hepatitis B  If you have a higher risk for hepatitis B, you should be screened for this virus. You are considered at high risk for hepatitis B if:  You were born in a country where hepatitis B is common. Ask your health care provider which countries are considered high risk.  Your parents were born in a high-risk country, and you have not been immunized against hepatitis B (hepatitis B vaccine).  You have HIV or AIDS.  You use needles to inject street drugs.  You live with someone who has hepatitis B.  You have had sex with someone who has hepatitis B.  You get hemodialysis treatment.  You take certain medicines for conditions, including cancer, organ transplantation, and autoimmune conditions. Hepatitis C  Blood testing is recommended for:  Everyone born from [birth year] through 1965.  Anyone with known risk factors for hepatitis C. Sexually transmitted infections (STIs)  You should be screened for sexually transmitted infections (STIs) including gonorrhea and chlamydia if:  You are sexually active and are younger than 41 years of age.  You are older than 41 years of age and your health care provider tells you that you are at risk for this type of infection.  Your sexual activity has changed since you were last screened and you are at an increased risk for chlamydia or gonorrhea. Ask your health care provider if you are at risk.  If you do not have HIV, but are at risk, it may be recommended that you take a prescription medicine daily to prevent HIV infection. This is called pre-exposure prophylaxis (PrEP). You are considered at risk if:  You are sexually active and do not regularly use condoms or know the HIV status of your partner(s).  You take drugs by injection.  You are sexually active with a partner  who has HIV. Talk with your health care provider about whether you are at high risk of being infected with HIV. If you choose to begin PrEP, you should first be tested for HIV. You should then be tested every 3 months for as long as you are taking PrEP. Pregnancy  If you are premenopausal and you may become pregnant, ask your health care provider about preconception counseling.  If you may become pregnant, take 400 to 800 micrograms (mcg) of folic acid  every day.  If you want to prevent pregnancy, talk to your health care provider about birth control (contraception). Osteoporosis and menopause  Osteoporosis is a disease in which the bones lose minerals and strength with aging. This can result in serious bone fractures. Your risk for osteoporosis can be identified using a bone density scan.  If you are 4 years of age or older, or if you are at risk for osteoporosis and fractures, ask your health care provider if you should be screened.  Ask your health care provider whether you should take a calcium or vitamin D supplement to lower your risk for osteoporosis.  Menopause may have certain physical symptoms and risks.  Hormone replacement therapy may reduce some of these symptoms and risks. Talk to your health care provider about whether hormone replacement therapy is right for you. Follow these instructions at home:  Schedule regular health, dental, and eye exams.  Stay current with your immunizations.  Do not use any tobacco products including cigarettes, chewing tobacco, or electronic cigarettes.  If you are pregnant, do not drink alcohol.  If you are breastfeeding, limit how much and how often you drink alcohol.  Limit alcohol intake to no more than 1 drink per day for nonpregnant women. One drink equals 12 ounces of beer, 5 ounces of wine, or 1 ounces of hard liquor.  Do not use street drugs.  Do not share needles.  Ask your health care provider for help if you need support  or information about quitting drugs.  Tell your health care provider if you often feel depressed.  Tell your health care provider if you have ever been abused or do not feel safe at home. This information is not intended to replace advice given to you by your health care provider. Make sure you discuss any questions you have with your health care provider. Document Released: 07/10/2010 Document Revised: 06/02/2015 Document Reviewed: 09/28/2014 Elsevier Interactive Patient Education  2017 [employer].

## 2016-04-16 NOTE — Progress Notes (Signed)
Cynai Skeens Jul 15, 1975 [id number]    History:    Presents for annual exam.  Mirena IUD placed 11/2013/intermittent spotting, non-bothersome.  2017 normal pap history. Mammogram scheduled 04/2016. Complains of weekly low back pain at work. Anxiety/depression controlled on Prozac. Quit smoking.   Past medical history, past surgical history, family history and social history were all reviewed and documented in the EPIC chart. 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 Son 15, stepson 20 does not live at home any longer. Also helps care for 62-year-old neice.  ROS:  A ROS was performed and pertinent positives and negatives are included.  Exam:  Vitals:   04/16/16 0845  BP: 124/86  Weight: 204 lb (92.5 kg)  Height:  (1.676 m)   Body mass index is 32.93 kg/m.   General appearance:  Normal Thyroid:  Symmetrical, normal in size, without palpable masses or nodularity. Respiratory  Auscultation:  Clear without wheezing or rhonchi Cardiovascular  Auscultation:  Regular rate, without rubs, murmurs or gallops  Edema/varicosities:  Not grossly evident Abdominal  Soft,nontender, without masses, guarding or rebound.  Liver/spleen:  No organomegaly noted  Hernia:  None appreciated  Skin  Inspection:  Grossly normal   Breasts: Examined lying and sitting.     Right: Without masses, retractions, discharge or axillary adenopathy.     Left: Without masses, retractions, discharge or axillary adenopathy. Gentitourinary   Inguinal/mons:  Normal without inguinal adenopathy  External genitalia:  Normal  BUS/Urethra/Skene's glands:  Normal  Vagina:  Normal  Cervix:  IUD strings visible  Uterus:  Normal in size, shape and contour.  Midline and mobile  Adnexa/parametria:     Rt: Without masses or tenderness.   Lt: Without masses or tenderness.  Anus and perineum: Normal  Digital rectal exam: Normal sphincter tone without palpated masses or tenderness  Assessment/Plan:  41 y.o.  MWF G1P1 + 1  adopted, one stepson for annual exam without complaints  Mirena IUD placed 11/2013 with minimal spotting Obesity Anxiety/depression controlled on Prozac  Plan: SBE's, annual mammogram screening, increase exercise and decrease calories for weight loss, MVI daily encouraged. Discussed choices for healthy diet and living. 10 pound weight gain over past year. Sac 20 mg by mouth daily prescription, proper use given and reviewed. States is doing well. Paps normal with negative HR HPV 2017, new screening guidelines reviewed. CBC, CMP, Lipid panel. Congratulated on no longer smoking.    Harrington Challenger [facility], 9:43 AM 04/16/2016

## 2016-04-17 ENCOUNTER — Other Ambulatory Visit: Payer: Self-pay | Admitting: Women's Health

## 2016-04-17 DIAGNOSIS — R928 Other abnormal and inconclusive findings on diagnostic imaging of breast: Secondary | ICD-10-CM

## 2016-04-18 DIAGNOSIS — H5213 Myopia, bilateral: Secondary | ICD-10-CM | POA: Diagnosis not present

## 2016-04-18 LAB — HEMOGLOBIN A1C
Hgb A1c MFr Bld: 5.2 % (ref <5.7)
MEAN PLASMA GLUCOSE: 103 mg/dL

## 2016-04-23 ENCOUNTER — Encounter: Payer: Self-pay | Admitting: Women's Health

## 2016-04-23 ENCOUNTER — Ambulatory Visit
Admission: RE | Admit: 2016-04-23 | Discharge: 2016-04-23 | Disposition: A | Discharge status: Home / Self Care | Payer: 59 | Source: Ambulatory Visit | Attending: Women's Health | Admitting: Women's Health

## 2016-04-23 DIAGNOSIS — R928 Other abnormal and inconclusive findings on diagnostic imaging of breast: Secondary | ICD-10-CM

## 2016-04-23 DIAGNOSIS — N6489 Other specified disorders of breast: Secondary | ICD-10-CM | POA: Diagnosis not present

## 2016-04-25 ENCOUNTER — Other Ambulatory Visit: Payer: 59

## 2016-05-12 DIAGNOSIS — M545 Low back pain: Secondary | ICD-10-CM | POA: Diagnosis not present

## 2016-05-12 DIAGNOSIS — M5136 Other intervertebral disc degeneration, lumbar region: Secondary | ICD-10-CM | POA: Diagnosis not present

## 2016-05-12 DIAGNOSIS — M5137 Other intervertebral disc degeneration, lumbosacral region: Secondary | ICD-10-CM | POA: Diagnosis not present

## 2016-05-14 DIAGNOSIS — M5441 Lumbago with sciatica, right side: Secondary | ICD-10-CM | POA: Diagnosis not present

## 2017-03-25 ENCOUNTER — Other Ambulatory Visit: Payer: Self-pay | Admitting: Women's Health

## 2017-03-25 DIAGNOSIS — Z1231 Encounter for screening mammogram for malignant neoplasm of breast: Secondary | ICD-10-CM

## 2017-04-24 ENCOUNTER — Encounter: Payer: 59 | Admitting: Women's Health

## 2017-04-24 ENCOUNTER — Ambulatory Visit: Payer: 59

## 2017-04-25 ENCOUNTER — Other Ambulatory Visit: Payer: Self-pay

## 2017-04-25 DIAGNOSIS — F4322 Adjustment disorder with anxiety: Secondary | ICD-10-CM

## 2017-04-25 MED ORDER — FLUOXETINE HCL 20 MG PO CAPS: ORAL_CAPSULE | ORAL | 0 refills | Status: DC

## 2017-04-25 NOTE — Telephone Encounter (Signed)
Has CE scheduled in July. 

## 2017-06-25 ENCOUNTER — Ambulatory Visit
Admission: RE | Admit: 2017-06-25 | Discharge: 2017-06-25 | Disposition: A | Discharge status: Home / Self Care | Payer: 59 | Source: Ambulatory Visit | Attending: Women's Health | Admitting: Women's Health

## 2017-06-25 ENCOUNTER — Ambulatory Visit (INDEPENDENT_AMBULATORY_CARE_PROVIDER_SITE_OTHER): Payer: 59 | Admitting: Women's Health

## 2017-06-25 ENCOUNTER — Encounter: Payer: Self-pay | Admitting: Women's Health

## 2017-06-25 ENCOUNTER — Ambulatory Visit: Payer: 59

## 2017-06-25 VITALS — BP 120/82 | Ht 67.0 in | Wt 205.0 lb

## 2017-06-25 DIAGNOSIS — Z13220 Encounter for screening for lipoid disorders: Secondary | ICD-10-CM

## 2017-06-25 DIAGNOSIS — Z01419 Encounter for gynecological examination (general) (routine) without abnormal findings: Secondary | ICD-10-CM

## 2017-06-25 DIAGNOSIS — Z1231 Encounter for screening mammogram for malignant neoplasm of breast: Secondary | ICD-10-CM | POA: Diagnosis not present

## 2017-06-25 LAB — LIPID PANEL
Cholesterol: 202 mg/dL — ABNORMAL HIGH (ref <200)
HDL: 56 mg/dL (ref >50)
LDL Cholesterol (Calc): 112 mg/dL (calc) — ABNORMAL HIGH
Non-HDL Cholesterol (Calc): 146 mg/dL (calc) — ABNORMAL HIGH (ref <130)
Total CHOL/HDL Ratio: 3.6 (calc) (ref <5.0)
Triglycerides: 219 mg/dL — ABNORMAL HIGH (ref <150)

## 2017-06-25 LAB — COMPREHENSIVE METABOLIC PANEL
AG RATIO: 1.6 (calc) (ref 1.0–2.5)
ALBUMIN MSPROF: 4.7 g/dL (ref 3.6–5.1)
ALT: 15 U/L (ref 6–29)
AST: 15 U/L (ref 10–30)
Alkaline phosphatase (APISO): 69 U/L (ref 33–115)
BILIRUBIN TOTAL: 0.5 mg/dL (ref 0.2–1.2)
BUN: 10 mg/dL (ref 7–25)
CALCIUM: 9.5 mg/dL (ref 8.6–10.2)
CO2: 24 mmol/L (ref 20–32)
Chloride: 103 mmol/L (ref 98–110)
Creat: 0.7 mg/dL (ref 0.50–1.10)
Globulin: 2.9 g/dL (calc) (ref 1.9–3.7)
Glucose, Bld: 94 mg/dL (ref 65–99)
POTASSIUM: 4.2 mmol/L (ref 3.5–5.3)
Sodium: 138 mmol/L (ref 135–146)
TOTAL PROTEIN: 7.6 g/dL (ref 6.1–8.1)

## 2017-06-25 LAB — CBC WITH DIFFERENTIAL/PLATELET
Basophils Absolute: 8 cells/uL (ref 0–200)
Basophils Relative: 0.1 %
EOS ABS: 143 {cells}/uL (ref 15–500)
EOS PCT: 1.9 %
HEMATOCRIT: 43.2 % (ref 35.0–45.0)
HEMOGLOBIN: 15.1 g/dL (ref 11.7–15.5)
LYMPHS ABS: 2370 {cells}/uL (ref 850–3900)
MCH: 30.1 pg (ref 27.0–33.0)
MCHC: 35 g/dL (ref 32.0–36.0)
MCV: 86.1 fL (ref 80.0–100.0)
MPV: 9.5 fL (ref 7.5–12.5)
Monocytes Relative: 8.3 %
NEUTROS ABS: 4358 {cells}/uL (ref 1500–7800)
Neutrophils Relative %: 58.1 %
Platelets: 333 10*3/uL (ref 140–400)
RBC: 5.02 10*6/uL (ref 3.80–5.10)
RDW: 12 % (ref 11.0–15.0)
Total Lymphocyte: 31.6 %
WBC: 7.5 10*3/uL (ref 3.8–10.8)
WBCMIX: 623 {cells}/uL (ref 200–950)

## 2017-06-25 NOTE — Patient Instructions (Signed)
Carbohydrate Counting for Diabetes Mellitus, Adult Carbohydrate counting is a method for keeping track of how many carbohydrates you eat. Eating carbohydrates naturally increases the amount of sugar (glucose) in the blood. Counting how many carbohydrates you eat helps keep your blood glucose within normal limits, which helps you manage your diabetes (diabetes mellitus). It is important to know how many carbohydrates you can safely have in each meal. This is different for every person. A diet and nutrition specialist (registered dietitian) can help you make a meal plan and calculate how many carbohydrates you should have at each meal and snack. Carbohydrates are found in the following foods:  Grains, such as breads and cereals.  Dried beans and soy products.  Starchy vegetables, such as potatoes, peas, and corn.  Fruit and fruit juices.  Milk and yogurt.  Sweets and snack foods, such as cake, cookies, candy, chips, and soft drinks.  How do I count carbohydrates? There are two ways to count carbohydrates in food. You can use either of the methods or a combination of both. Reading "Nutrition Facts" on packaged food The "Nutrition Facts" list is included on the labels of almost all packaged foods and beverages in the [place] It includes:  The serving size.  Information about nutrients in each serving, including the grams (g) of carbohydrate per serving.  To use the "Nutrition Facts":  Decide how many servings you will have.  Multiply the number of servings by the number of carbohydrates per serving.  The resulting number is the total amount of carbohydrates that you will be having.  Learning standard serving sizes of other foods When you eat foods containing carbohydrates that are not packaged or do not include "Nutrition Facts" on the label, you need to measure the servings in order to count the amount of carbohydrates:  Measure the foods that you will eat with a food scale or  measuring cup, if needed.  Decide how many standard-size servings you will eat.  Multiply the number of servings by 15. Most carbohydrate-rich foods have about 15 g of carbohydrates per serving. ? For example, if you eat 8 oz (170 g) of strawberries, you will have eaten 2 servings and 30 g of carbohydrates (2 servings x 15 g = 30 g).  For foods that have more than one food mixed, such as soups and casseroles, you must count the carbohydrates in each food that is included.  The following list contains standard serving sizes of common carbohydrate-rich foods. Each of these servings has about 15 g of carbohydrates:   hamburger bun or  English muffin.   oz (15 mL) syrup.   oz (14 g) jelly.  1 slice of bread.  1 six-inch tortilla.  3 oz (85 g) cooked rice or pasta.  4 oz (113 g) cooked dried beans.  4 oz (113 g) starchy vegetable, such as peas, corn, or potatoes.  4 oz (113 g) hot cereal.  4 oz (113 g) mashed potatoes or  of a large baked potato.  4 oz (113 g) canned or frozen fruit.  4 oz (120 mL) fruit juice.  4-6 crackers.  6 chicken nuggets.  6 oz (170 g) unsweetened dry cereal.  6 oz (170 g) plain fat-free yogurt or yogurt sweetened with artificial sweeteners.  8 oz (240 mL) milk.  8 oz (170 g) fresh fruit or one small piece of fruit.  24 oz (680 g) popped popcorn.  Example of carbohydrate counting Sample meal  3 oz (85 g) chicken breast.  6 oz (170 g) brown rice.  4 oz (113 g) corn.  8 oz (240 mL) milk.  8 oz (170 g) strawberries with sugar-free whipped topping. Carbohydrate calculation 1. Identify the foods that contain carbohydrates: ? Rice. ? Corn. ? Milk. ? Strawberries. 2. Calculate how many servings you have of each food: ? 2 servings rice. ? 1 serving corn. ? 1 serving milk. ? 1 serving strawberries. 3. Multiply each number of servings by 15 g: ? 2 servings rice x 15 g = 30 g. ? 1 serving corn x 15 g = 15 g. ? 1 serving milk x 15  g = 15 g. ? 1 serving strawberries x 15 g = 15 g. 4. Add together all of the amounts to find the total grams of carbohydrates eaten: ? 30 g + 15 g + 15 g + 15 g = 75 g of carbohydrates total. This information is not intended to replace advice given to you by your health care provider. Make sure you discuss any questions you have with your health care provider. Document Released: 12/25/2004 Document Revised: 07/15/2015 Document Reviewed: 06/08/2015 Elsevier Interactive Patient Education  2018 [place] Maintenance, Female Adopting a healthy lifestyle and getting preventive care can go a long way to promote health and wellness. Talk with your health care provider about what schedule of regular examinations is right for you. This is a good chance for you to check in with your provider about disease prevention and staying healthy. In between checkups, there are plenty of things you can do on your own. Experts have done a lot of research about which lifestyle changes and preventive measures are most likely to keep you healthy. Ask your health care provider for more information. Weight and diet Eat a healthy diet  Be sure to include plenty of vegetables, fruits, low-fat dairy products, and lean protein.  Do not eat a lot of foods high in solid fats, added sugars, or salt.  Get regular exercise. This is one of the most important things you can do for your health. ? Most adults should exercise for at least 150 minutes each week. The exercise should increase your heart rate and make you sweat (moderate-intensity exercise). ? Most adults should also do strengthening exercises at least twice a week. This is in addition to the moderate-intensity exercise.  Maintain a healthy weight  Body mass index (BMI) is a measurement that can be used to identify possible weight problems. It estimates body fat based on height and weight. Your health care provider can help determine your BMI and help you  achieve or maintain a healthy weight.  For females 76 years of age and older: ? A BMI below 18.5 is considered underweight. ? A BMI of 18.5 to 24.9 is normal. ? A BMI of 25 to 29.9 is considered overweight. ? A BMI of 30 and above is considered obese.  Watch levels of cholesterol and blood lipids  You should start having your blood tested for lipids and cholesterol at 42 years of age, then have this test every 5 years.  You may need to have your cholesterol levels checked more often if: ? Your lipid or cholesterol levels are high. ? You are older than 42 years of age. ? You are at high risk for heart disease.  Cancer screening Lung Cancer  Lung cancer screening is recommended for adults 52-25 years old who are at high risk for lung cancer because of a history of smoking.  A  yearly low-dose CT scan of the lungs is recommended for people who: ? Currently smoke. ? Have quit within the past 15 years. ? Have at least a 30-pack-year history of smoking. A pack year is smoking an average of one pack of cigarettes a day for 1 year.  Yearly screening should continue until it has been 15 years since you quit.  Yearly screening should stop if you develop a health problem that would prevent you from having lung cancer treatment.  Breast Cancer  Practice breast self-awareness. This means understanding how your breasts normally appear and feel.  It also means doing regular breast self-exams. Let your health care provider know about any changes, no matter how small.  If you are in your 20s or 30s, you should have a clinical breast exam (CBE) by a health care provider every 1-3 years as part of a regular health exam.  If you are 4 or older, have a CBE every year. Also consider having a breast X-ray (mammogram) every year.  If you have a family history of breast cancer, talk to your health care provider about genetic screening.  If you are at high risk for breast cancer, talk to your health  care provider about having an MRI and a mammogram every year.  Breast cancer gene (BRCA) assessment is recommended for women who have family members with BRCA-related cancers. BRCA-related cancers include: ? Breast. ? Ovarian. ? Tubal. ? Peritoneal cancers.  Results of the assessment will determine the need for genetic counseling and BRCA1 and BRCA2 testing.  Cervical Cancer Your health care provider may recommend that you be screened regularly for cancer of the pelvic organs (ovaries, uterus, and vagina). This screening involves a pelvic examination, including checking for microscopic changes to the surface of your cervix (Pap test). You may be encouraged to have this screening done every 3 years, beginning at age 12.  For women ages 13-65, health care providers may recommend pelvic exams and Pap testing every 3 years, or they may recommend the Pap and pelvic exam, combined with testing for human papilloma virus (HPV), every 5 years. Some types of HPV increase your risk of cervical cancer. Testing for HPV may also be done on women of any age with unclear Pap test results.  Other health care providers may not recommend any screening for nonpregnant women who are considered low risk for pelvic cancer and who do not have symptoms. Ask your health care provider if a screening pelvic exam is right for you.  If you have had past treatment for cervical cancer or a condition that could lead to cancer, you need Pap tests and screening for cancer for at least 20 years after your treatment. If Pap tests have been discontinued, your risk factors (such as having a new sexual partner) need to be reassessed to determine if screening should resume. Some women have medical problems that increase the chance of getting cervical cancer. In these cases, your health care provider may recommend more frequent screening and Pap tests.  Colorectal Cancer  This type of cancer can be detected and often  prevented.  Routine colorectal cancer screening usually begins at 42 years of age and continues through 42 years of age.  Your health care provider may recommend screening at an earlier age if you have risk factors for colon cancer.  Your health care provider may also recommend using home test kits to check for hidden blood in the stool.  A small camera at the end of  a tube can be used to examine your colon directly (sigmoidoscopy or colonoscopy). This is done to check for the earliest forms of colorectal cancer.  Routine screening usually begins at age 45.  Direct examination of the colon should be repeated every 5-10 years through 42 years of age. However, you may need to be screened more often if early forms of precancerous polyps or small growths are found.  Skin Cancer  Check your skin from head to toe regularly.  Tell your health care provider about any new moles or changes in moles, especially if there is a change in a mole's shape or color.  Also tell your health care provider if you have a mole that is larger than the size of a pencil eraser.  Always use sunscreen. Apply sunscreen liberally and repeatedly throughout the day.  Protect yourself by wearing long sleeves, pants, a wide-brimmed hat, and sunglasses whenever you are outside.  Heart disease, diabetes, and high blood pressure  High blood pressure causes heart disease and increases the risk of stroke. High blood pressure is more likely to develop in: ? People who have blood pressure in the high end of the normal range (130-139/85-89 mm Hg). ? People who are overweight or obese. ? People who are African American.  If you are 65-44 years of age, have your blood pressure checked every 3-5 years. If you are 35 years of age or older, have your blood pressure checked every year. You should have your blood pressure measured twice-once when you are at a hospital or clinic, and once when you are not at a hospital or clinic.  Record the average of the two measurements. To check your blood pressure when you are not at a hospital or clinic, you can use: ? An automated blood pressure machine at a pharmacy. ? A home blood pressure monitor.  If you are between 55 years and 4 years old, ask your health care provider if you should take aspirin to prevent strokes.  Have regular diabetes screenings. This involves taking a blood sample to check your fasting blood sugar level. ? If you are at a normal weight and have a low risk for diabetes, have this test once every three years after 42 years of age. ? If you are overweight and have a high risk for diabetes, consider being tested at a younger age or more often. Preventing infection Hepatitis B  If you have a higher risk for hepatitis B, you should be screened for this virus. You are considered at high risk for hepatitis B if: ? You were born in a country where hepatitis B is common. Ask your health care provider which countries are considered high risk. ? Your parents were born in a high-risk country, and you have not been immunized against hepatitis B (hepatitis B vaccine). ? You have HIV or AIDS. ? You use needles to inject street drugs. ? You live with someone who has hepatitis B. ? You have had sex with someone who has hepatitis B. ? You get hemodialysis treatment. ? You take certain medicines for conditions, including cancer, organ transplantation, and autoimmune conditions.  Hepatitis C  Blood testing is recommended for: ? Everyone born from [birth year] through 1965. ? Anyone with known risk factors for hepatitis C.  Sexually transmitted infections (STIs)  You should be screened for sexually transmitted infections (STIs) including gonorrhea and chlamydia if: ? You are sexually active and are younger than 42 years of age. ? You are older than  42 years of age and your health care provider tells you that you are at risk for this type of infection. ? Your sexual  activity has changed since you were last screened and you are at an increased risk for chlamydia or gonorrhea. Ask your health care provider if you are at risk.  If you do not have HIV, but are at risk, it may be recommended that you take a prescription medicine daily to prevent HIV infection. This is called pre-exposure prophylaxis (PrEP). You are considered at risk if: ? You are sexually active and do not regularly use condoms or know the HIV status of your partner(s). ? You take drugs by injection. ? You are sexually active with a partner who has HIV.  Talk with your health care provider about whether you are at high risk of being infected with HIV. If you choose to begin PrEP, you should first be tested for HIV. You should then be tested every 3 months for as long as you are taking PrEP. Pregnancy  If you are premenopausal and you may become pregnant, ask your health care provider about preconception counseling.  If you may become pregnant, take 400 to 800 micrograms (mcg) of folic acid every day.  If you want to prevent pregnancy, talk to your health care provider about birth control (contraception). Osteoporosis and menopause  Osteoporosis is a disease in which the bones lose minerals and strength with aging. This can result in serious bone fractures. Your risk for osteoporosis can be identified using a bone density scan.  If you are 36 years of age or older, or if you are at risk for osteoporosis and fractures, ask your health care provider if you should be screened.  Ask your health care provider whether you should take a calcium or vitamin D supplement to lower your risk for osteoporosis.  Menopause may have certain physical symptoms and risks.  Hormone replacement therapy may reduce some of these symptoms and risks. Talk to your health care provider about whether hormone replacement therapy is right for you. Follow these instructions at home:  Schedule regular health, dental,  and eye exams.  Stay current with your immunizations.  Do not use any tobacco products including cigarettes, chewing tobacco, or electronic cigarettes.  If you are pregnant, do not drink alcohol.  If you are breastfeeding, limit how much and how often you drink alcohol.  Limit alcohol intake to no more than 1 drink per day for nonpregnant women. One drink equals 12 ounces of beer, 5 ounces of wine, or 1 ounces of hard liquor.  Do not use street drugs.  Do not share needles.  Ask your health care provider for help if you need support or information about quitting drugs.  Tell your health care provider if you often feel depressed.  Tell your health care provider if you have ever been abused or do not feel safe at home. This information is not intended to replace advice given to you by your health care provider. Make sure you discuss any questions you have with your health care provider. Document Released: 07/10/2010 Document Revised: 06/02/2015 Document Reviewed: 09/28/2014 Elsevier Interactive Patient Education  [employer].

## 2017-06-25 NOTE — Progress Notes (Signed)
Lucita LoraConnie S Reich 11/22/1975 [id number]    History:    Presents for annual exam.  11/2013 Mirena IUD amenorrheic.  Quit smoking last year.  Normal Pap and mammogram history, had  mammogram today/normal.  Anxiety depression managed on Prozac per primary care.  Past medical history, past surgical history, family history and social history were all reviewed and documented in the EPIC chart.  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  ROS:  A ROS was performed and pertinent positives and negatives are included.  Exam:  Vitals:   06/25/17 1156  BP: 120/82  Weight: 205 lb (93 kg)  Height: 5\' 7"  (1.702 m)   Body mass index is 32.11 kg/m.   General appearance:  Normal Thyroid:  Symmetrical, normal in size, without palpable masses or nodularity. Respiratory  Auscultation:  Clear without wheezing or rhonchi Cardiovascular  Auscultation:  Regular rate, without rubs, murmurs or gallops  Edema/varicosities:  Not grossly evident Abdominal  Soft,nontender, without masses, guarding or rebound.  Liver/spleen:  No organomegaly noted  Hernia:  None appreciated  Skin  Inspection:  Grossly normal   Breasts: Examined lying and sitting.     Right: Without masses, retractions, discharge or axillary adenopathy.     Left: Without masses, retractions, discharge or axillary adenopathy. Gentitourinary   Inguinal/mons:  Normal without inguinal adenopathy  External genitalia:  Normal  BUS/Urethra/Skene's glands:  Normal  Vagina:  Normal  Cervix:  Normal IUD strings visible  Uterus:   normal in size, shape and contour.  Midline and mobile  Adnexa/parametria:     Rt: Without masses or tenderness.   Lt: Without masses or tenderness.  Anus and perineum: Normal  Digital rectal exam: Normal sphincter tone without palpated masses or tenderness  Assessment/Plan:  42 y.o. MWF G1, P1 for annual exam with no complaints.  11/2013 Mirena IUD  amenorrhea Anxiety and depression stable on Prozac per primary care Obesity  Plan: SBE's, continue annual screening mammogram, increase regular cardio type exercise, calcium rich foods, vitamin D 1000 daily encouraged.  Reviewed importance of decreasing carbs/calories.  CBC,  CMP, lipid panel, Pap normal 2017, new screening guidelines reviewed.   Harrington Challengerancy J Young [facility] CenterWHNP, 12:28 PM 06/25/2017

## 2017-08-28 ENCOUNTER — Other Ambulatory Visit: Payer: Self-pay

## 2017-08-28 DIAGNOSIS — F4322 Adjustment disorder with anxiety: Secondary | ICD-10-CM

## 2017-08-28 MED ORDER — FLUOXETINE HCL 20 MG PO CAPS: ORAL_CAPSULE | ORAL | 1 refills | Status: DC

## 2017-08-28 NOTE — Telephone Encounter (Signed)
OK for refill.

## 2017-11-23 DIAGNOSIS — J209 Acute bronchitis, unspecified: Secondary | ICD-10-CM | POA: Diagnosis not present

## 2018-03-06 DIAGNOSIS — J101 Influenza due to other identified influenza virus with other respiratory manifestations: Secondary | ICD-10-CM | POA: Diagnosis not present

## 2018-03-06 DIAGNOSIS — R509 Fever, unspecified: Secondary | ICD-10-CM | POA: Diagnosis not present

## 2018-03-20 IMAGING — MG 2D DIGITAL SCREENING BILATERAL MAMMOGRAM WITH CAD AND ADJUNCT TO
2 series · 3 of 6 positions shown · non-contrast
Comparison: None

CLINICAL DATA: Screening. Baseline exam

EXAM:
2D DIGITAL SCREENING BILATERAL MAMMOGRAM WITH CAD AND ADJUNCT TOMO

[R CC]
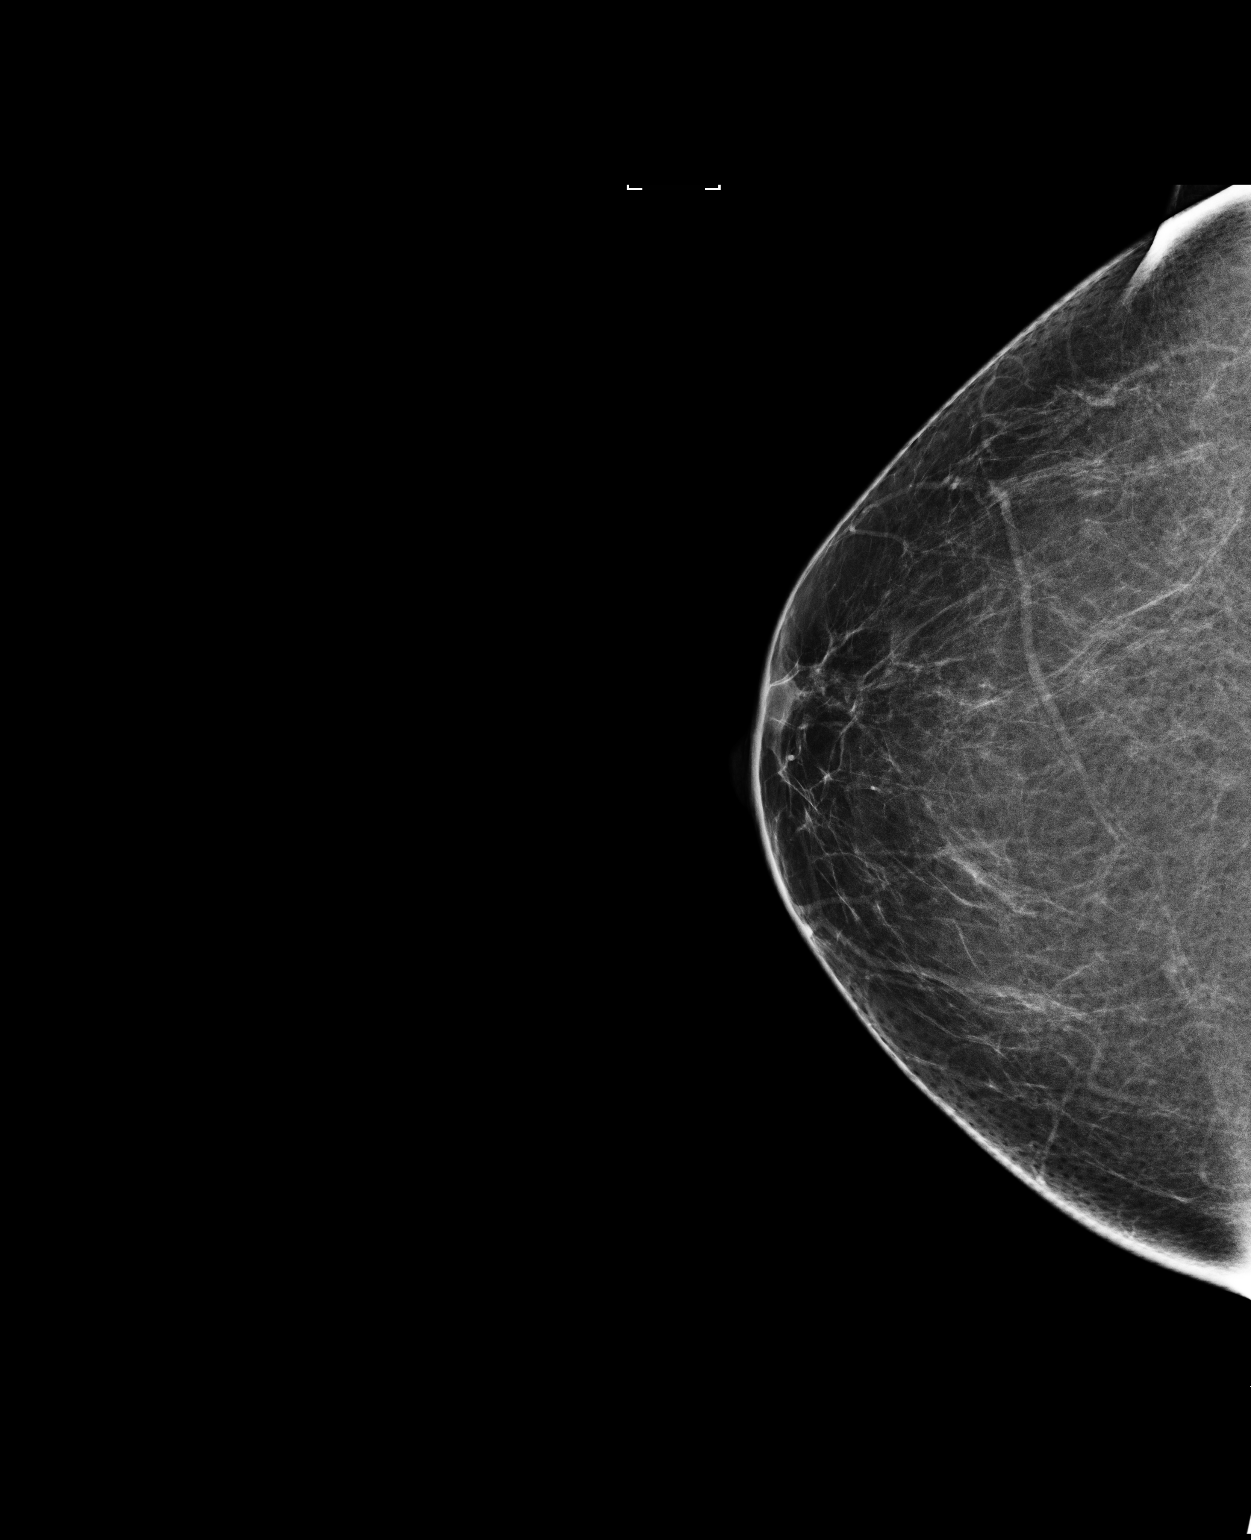

[R CC tomo · 2 of 75 frames shown]
[frame 25/75]
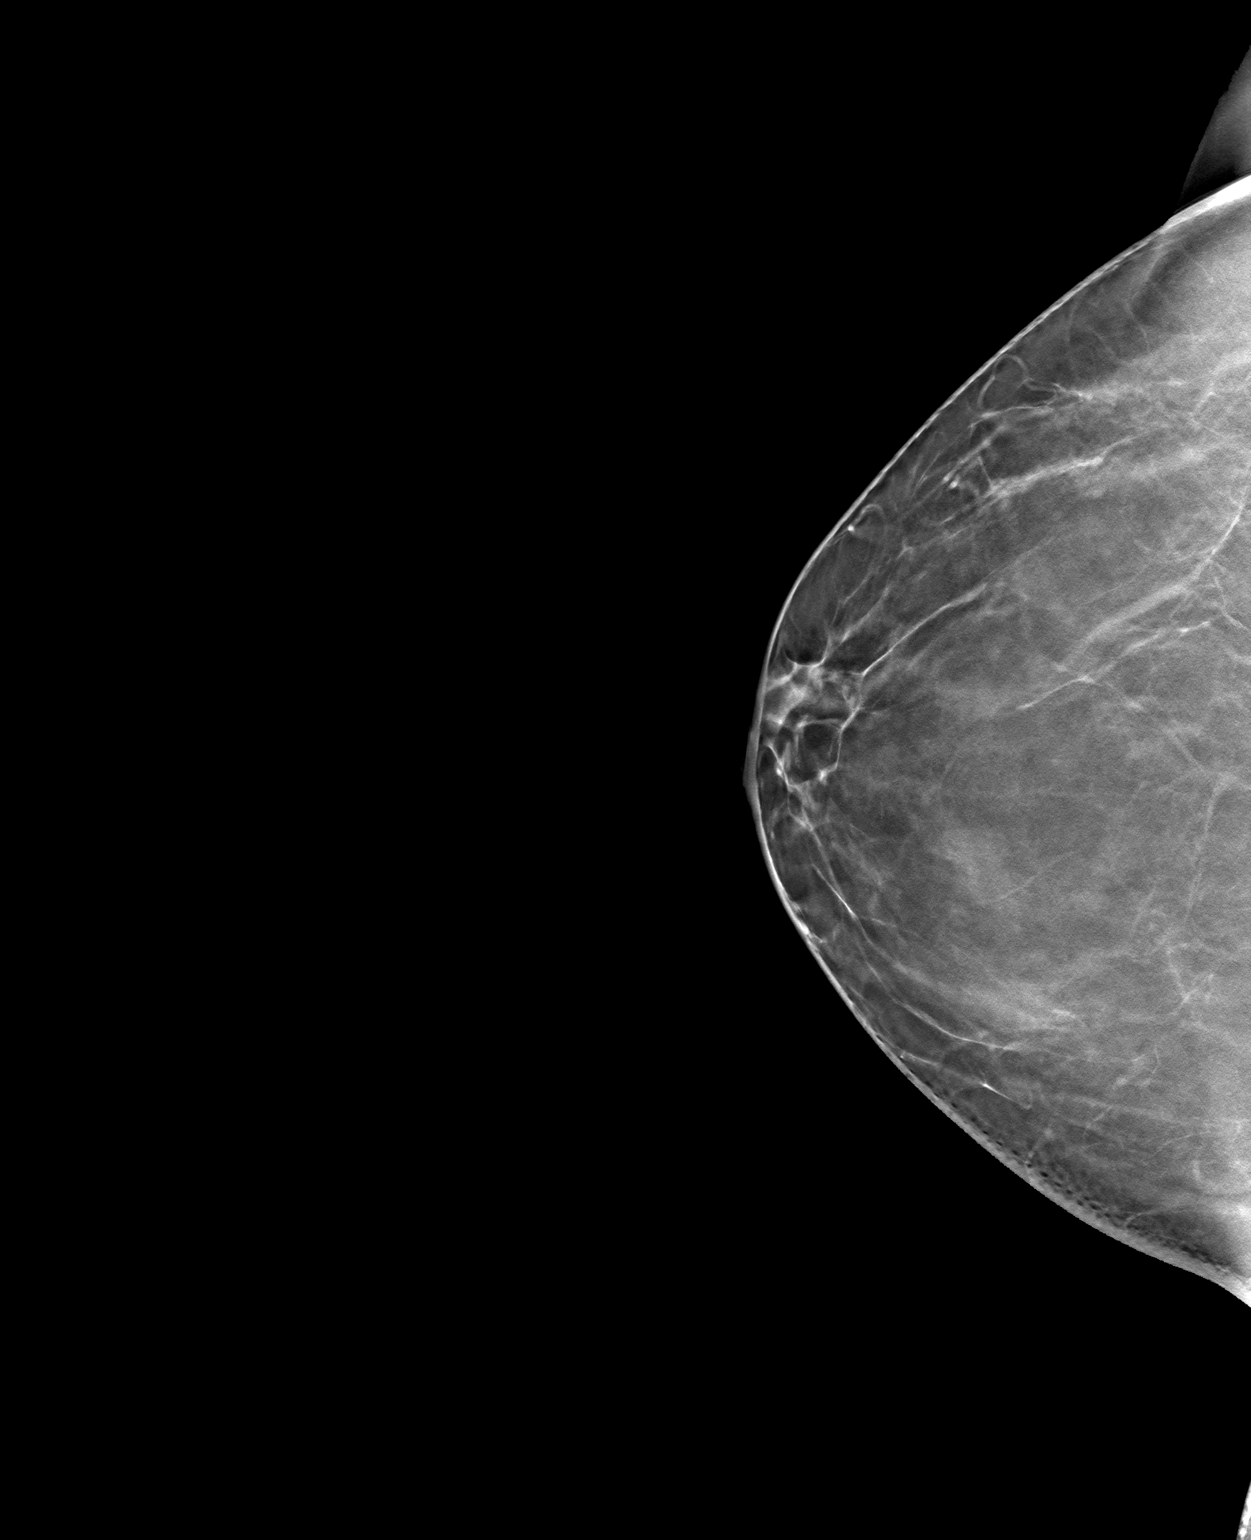
[frame 38/75]
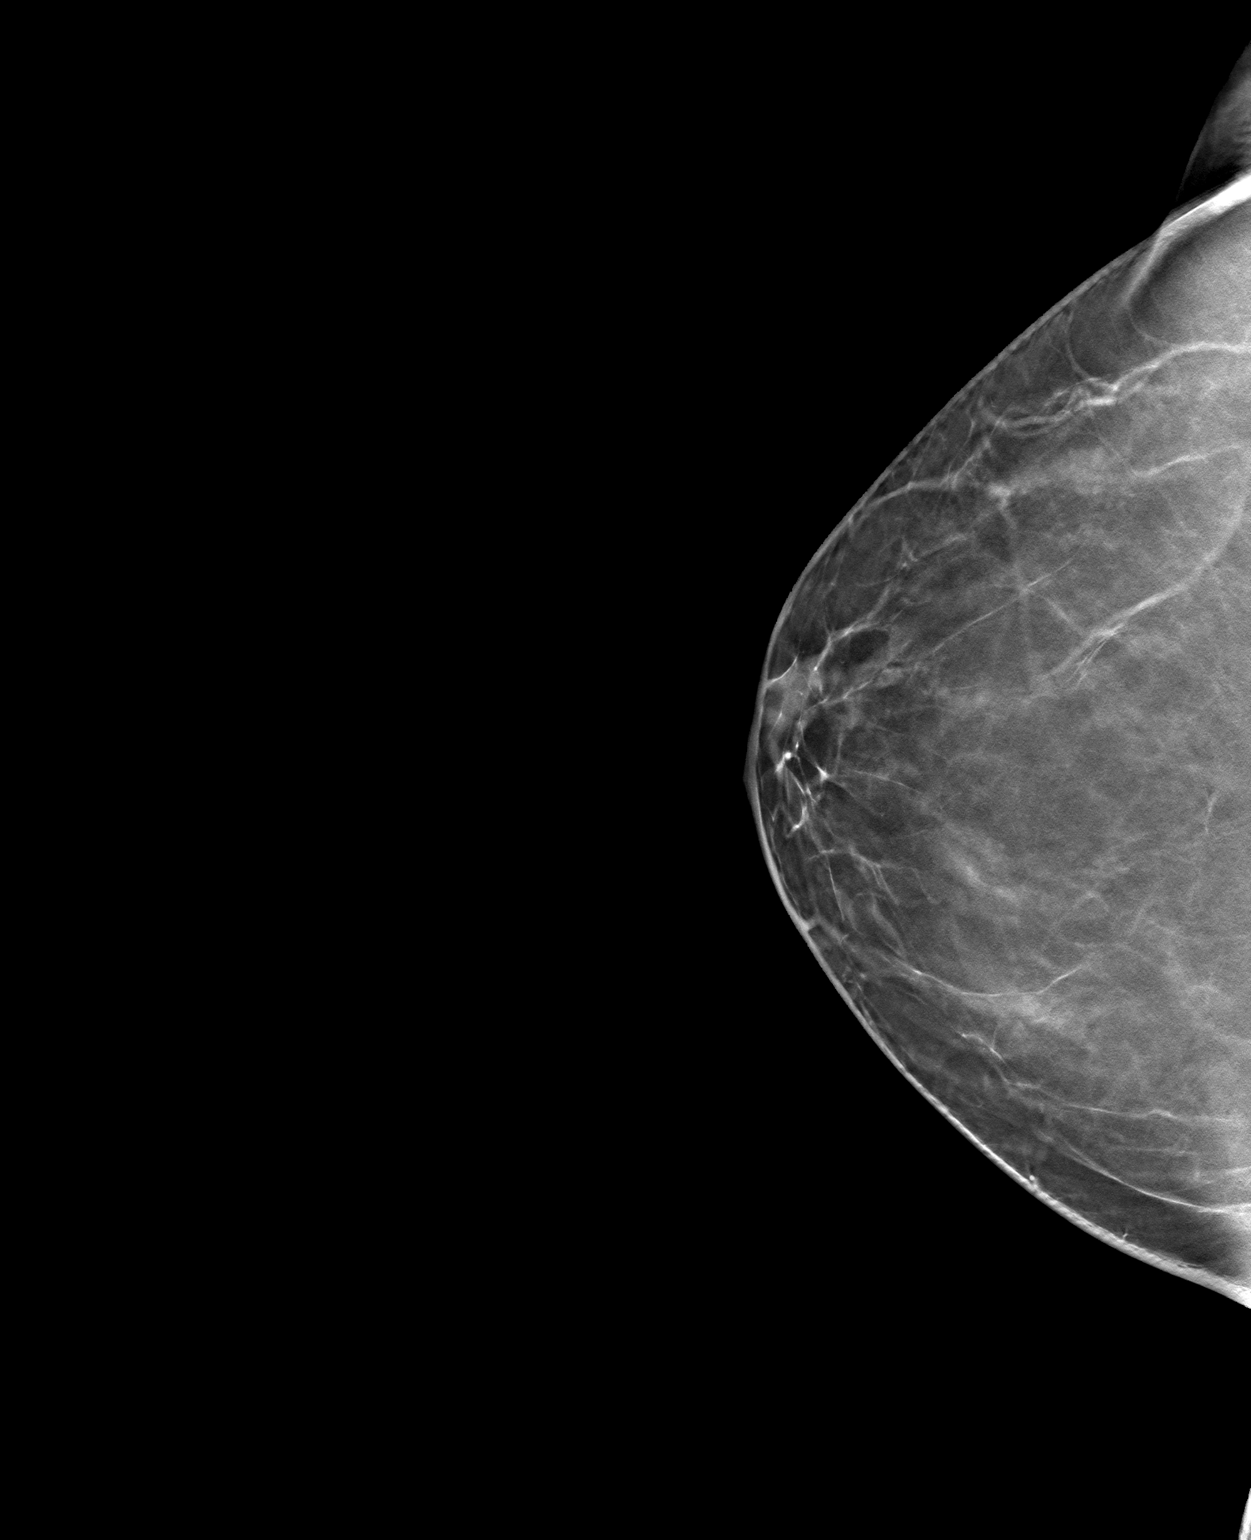

[3 of 6 positions shown; findings below may reference images not displayed]

ACR Breast Density Category b: There are scattered areas of
fibroglandular density.
FINDINGS: In the left breast, a possible mass warrants further evaluation. In
the right breast, no findings suspicious for malignancy.

Images were processed with CAD.
IMPRESSION: Further evaluation is suggested for possible mass in the left
breast.

RECOMMENDATION:
Diagnostic mammogram and possibly ultrasound of the left breast.
(Code:[email or web address])

The patient will be contacted regarding the findings, and additional
imaging will be scheduled.

BI-RADS CATEGORY  0: Incomplete. Need additional imaging evaluation
and/or prior mammograms for comparison.

## 2018-04-16 ENCOUNTER — Other Ambulatory Visit: Payer: Self-pay | Admitting: Women's Health

## 2018-04-16 DIAGNOSIS — F4322 Adjustment disorder with anxiety: Secondary | ICD-10-CM

## 2018-04-16 NOTE — Telephone Encounter (Signed)
annual scheduled on 07/01/18

## 2018-04-16 NOTE — Telephone Encounter (Signed)
Okay for refill?  

## 2018-07-01 ENCOUNTER — Encounter: Payer: 59 | Admitting: Women's Health

## 2018-07-25 ENCOUNTER — Other Ambulatory Visit: Payer: Self-pay | Admitting: Women's Health

## 2018-07-25 DIAGNOSIS — F4322 Adjustment disorder with anxiety: Secondary | ICD-10-CM

## 2018-08-15 ENCOUNTER — Other Ambulatory Visit: Payer: Self-pay

## 2018-08-18 ENCOUNTER — Other Ambulatory Visit: Payer: Self-pay | Admitting: Women's Health

## 2018-08-18 ENCOUNTER — Other Ambulatory Visit: Payer: Self-pay

## 2018-08-18 ENCOUNTER — Encounter: Payer: Self-pay | Admitting: Women's Health

## 2018-08-18 ENCOUNTER — Ambulatory Visit (INDEPENDENT_AMBULATORY_CARE_PROVIDER_SITE_OTHER): Payer: 59 | Admitting: Women's Health

## 2018-08-18 VITALS — BP 120/84 | Ht 65.5 in | Wt 203.0 lb

## 2018-08-18 DIAGNOSIS — Z1151 Encounter for screening for human papillomavirus (HPV): Secondary | ICD-10-CM

## 2018-08-18 DIAGNOSIS — Z01419 Encounter for gynecological examination (general) (routine) without abnormal findings: Secondary | ICD-10-CM | POA: Diagnosis not present

## 2018-08-18 DIAGNOSIS — F4322 Adjustment disorder with anxiety: Secondary | ICD-10-CM | POA: Diagnosis not present

## 2018-08-18 DIAGNOSIS — Z13220 Encounter for screening for lipoid disorders: Secondary | ICD-10-CM

## 2018-08-18 MED ORDER — FLUOXETINE HCL 20 MG PO CAPS: ORAL_CAPSULE | ORAL | 4 refills | Status: DC

## 2018-08-18 MED ORDER — BETAMETHASONE VALERATE 0.1 % EX OINT: 1.0000 "application " | TOPICAL_OINTMENT | Freq: Two times a day (BID) | CUTANEOUS | 1 refills | Status: DC

## 2018-08-18 NOTE — Patient Instructions (Signed)
Health Maintenance, Female Adopting a healthy lifestyle and getting preventive care are important in promoting health and wellness. Ask your health care provider about:  The right schedule for you to have regular tests and exams.  Things you can do on your own to prevent diseases and keep yourself healthy. What should I know about diet, weight, and exercise? Eat a healthy diet   Eat a diet that includes plenty of vegetables, fruits, low-fat dairy products, and lean protein.  Do not eat a lot of foods that are high in solid fats, added sugars, or sodium. Maintain a healthy weight Body mass index (BMI) is used to identify weight problems. It estimates body fat based on height and weight. Your health care provider can help determine your BMI and help you achieve or maintain a healthy weight. Get regular exercise Get regular exercise. This is one of the most important things you can do for your health. Most adults should:  Exercise for at least 150 minutes each week. The exercise should increase your heart rate and make you sweat (moderate-intensity exercise).  Do strengthening exercises at least twice a week. This is in addition to the moderate-intensity exercise.  Spend less time sitting. Even light physical activity can be beneficial. Watch cholesterol and blood lipids Have your blood tested for lipids and cholesterol at 43 years of age, then have this test every 5 years. Have your cholesterol levels checked more often if:  Your lipid or cholesterol levels are high.  You are older than 43 years of age.  You are at high risk for heart disease. What should I know about cancer screening? Depending on your health history and family history, you may need to have cancer screening at various ages. This may include screening for:  Breast cancer.  Cervical cancer.  Colorectal cancer.  Skin cancer.  Lung cancer. What should I know about heart disease, diabetes, and high blood  pressure? Blood pressure and heart disease  High blood pressure causes heart disease and increases the risk of stroke. This is more likely to develop in people who have high blood pressure readings, are of African descent, or are overweight.  Have your blood pressure checked: ? Every 3-5 years if you are 18-39 years of age. ? Every year if you are 40 years old or older. Diabetes Have regular diabetes screenings. This checks your fasting blood sugar level. Have the screening done:  Once every three years after age 40 if you are at a normal weight and have a low risk for diabetes.  More often and at a younger age if you are overweight or have a high risk for diabetes. What should I know about preventing infection? Hepatitis B If you have a higher risk for hepatitis B, you should be screened for this virus. Talk with your health care provider to find out if you are at risk for hepatitis B infection. Hepatitis C Testing is recommended for:  Everyone born from 1945 through 1965.  Anyone with known risk factors for hepatitis C. Sexually transmitted infections (STIs)  Get screened for STIs, including gonorrhea and chlamydia, if: ? You are sexually active and are younger than 43 years of age. ? You are older than 43 years of age and your health care provider tells you that you are at risk for this type of infection. ? Your sexual activity has changed since you were last screened, and you are at increased risk for chlamydia or gonorrhea. Ask your health care provider if   you are at risk.  Ask your health care provider about whether you are at high risk for HIV. Your health care provider may recommend a prescription medicine to help prevent HIV infection. If you choose to take medicine to prevent HIV, you should first get tested for HIV. You should then be tested every 3 months for as long as you are taking the medicine. Pregnancy  If you are about to stop having your period (premenopausal) and  you may become pregnant, seek counseling before you get pregnant.  Take 400 to 800 micrograms (mcg) of folic acid every day if you become pregnant.  Ask for birth control (contraception) if you want to prevent pregnancy. Osteoporosis and menopause Osteoporosis is a disease in which the bones lose minerals and strength with aging. This can result in bone fractures. If you are 65 years old or older, or if you are at risk for osteoporosis and fractures, ask your health care provider if you should:  Be screened for bone loss.  Take a calcium or vitamin D supplement to lower your risk of fractures.  Be given hormone replacement therapy (HRT) to treat symptoms of menopause. Follow these instructions at home: Lifestyle  Do not use any products that contain nicotine or tobacco, such as cigarettes, e-cigarettes, and chewing tobacco. If you need help quitting, ask your health care provider.  Do not use street drugs.  Do not share needles.  Ask your health care provider for help if you need support or information about quitting drugs. Alcohol use  Do not drink alcohol if: ? Your health care provider tells you not to drink. ? You are pregnant, may be pregnant, or are planning to become pregnant.  If you drink alcohol: ? Limit how much you use to 0-1 drink a day. ? Limit intake if you are breastfeeding.  Be aware of how much alcohol is in your drink. In the [place] one drink equals one 12 oz bottle of beer (355 mL), one 5 oz glass of wine (148 mL), or one 1 oz glass of hard liquor (44 mL). General instructions  Schedule regular health, dental, and eye exams.  Stay current with your vaccines.  Tell your health care provider if: ? You often feel depressed. ? You have ever been abused or do not feel safe at home. Summary  Adopting a healthy lifestyle and getting preventive care are important in promoting health and wellness.  Follow your health care provider's instructions about healthy  diet, exercising, and getting tested or screened for diseases.  Follow your health care provider's instructions on monitoring your cholesterol and blood pressure. This information is not intended to replace advice given to you by your health care provider. Make sure you discuss any questions you have with your health care provider. Document Released: 07/10/2010 Document Revised: 12/18/2017 Document Reviewed: 12/18/2017 Elsevier Patient Education  2020 Elsevier Inc.  

## 2018-08-18 NOTE — Progress Notes (Addendum)
Jaime Klein [birth year] [id number]    History:    Presents for annual exam.  11/2013 Mirena IUD amenorrheic.  Normal Pap and mammogram history.  Anxiety/depression stable on Prozac.  Has full legal custody of stepsons 43-year-old daughter Jaime Klein since Jan.(stepson in prison - drugs).   Past medical history, past surgical history, family history and social history were all reviewed and documented in the EPIC chart.  Hairdresser.  Husband is a Psychologist, sport and exercise.  Also has full custody of 5 yo niece who she is cared for since a small child, (brother history of drug abuse.)  Jaime Klein 18 planning to go into farming with his father after high school graduation.  Jaime Klein 41-year-old niece also frequently stays with her..  ROS:  A ROS was performed and pertinent positives and negatives are included.  Exam:  Vitals:   08/18/18 0901  BP: 120/84  Weight: 203 lb (92.1 kg)  Height: 5' 5.5" (1.664 m)   Body mass index is 33.27 kg/m.   General appearance:  Normal Thyroid:  Symmetrical, normal in size, without palpable masses or nodularity. Respiratory  Auscultation:  Clear without wheezing or rhonchi Cardiovascular  Auscultation:  Regular rate, without rubs, murmurs or gallops  Edema/varicosities:  Not grossly evident Abdominal  Soft,nontender, without masses, guarding or rebound.  Liver/spleen:  No organomegaly noted  Hernia:  None appreciated  Skin  Inspection:  Grossly normal   Breasts: Examined lying and sitting.     Right: Without masses, retractions, discharge or axillary adenopathy.     Left: Without masses, retractions, discharge or axillary adenopathy. Gentitourinary   Inguinal/mons:  Normal without inguinal adenopathy  External genitalia:  Normal  BUS/Urethra/Skene's glands:  Normal  Vagina:  Normal  Cervix:  Normal IUD string visible  Uterus:   normal in size, shape and contour.  Midline and mobile  Adnexa/parametria:     Rt: Without masses or tenderness.   Lt: Without masses or  tenderness.  Anus and perineum: Normal  Digital rectal exam: Normal sphincter tone without palpated masses or tenderness  Assessment/Plan:  43 y.o. MWF G1 P1 +2 adopted for annual exam with no complaints.  11/2018 Mirena IUD amenorrhea Anxiety/depression stable on Prozac Obesity Psoriasis  Plan: Schedule Mirena IUD to be removed and replaced October 2020.  SBEs, annual mammogram due instructed to schedule, calcium rich foods, vitamin D 1000 daily encouraged..  Prozac 20 mg p.o. daily prescription, proper use given and reviewed, encouraged self-care and leisure activities, counseling as needed.  Has had counseling in the past year with 11-year-old stepgranddaughter  who she has legal custody.  Valisone 0.1% twice daily as needed to resolving psoriasis patches on lower legs.  Follow-up with dermatology if continued problems.  Encouraged to increase regular exercise and decrease calorie/carbs.  CBC, CMP, lipid panel, Pap with HR HPV typing, new screening guidelines reviewed.    Huel Cote [facility], 10:02 AM 08/18/2018

## 2018-08-20 ENCOUNTER — Encounter: Payer: Self-pay | Admitting: Women's Health

## 2018-08-20 LAB — PAP, TP IMAGING W/ HPV RNA, RFLX HPV TYPE 16,18/45: HPV DNA High Risk: NOT DETECTED

## 2018-08-20 LAB — LIPID PANEL
Cholesterol: 211 mg/dL — ABNORMAL HIGH (ref <200)
HDL: 58 mg/dL (ref >=50)
LDL Cholesterol (Calc): 127 mg/dL (calc) — ABNORMAL HIGH
Non-HDL Cholesterol (Calc): 153 mg/dL (calc) — ABNORMAL HIGH (ref <130)
Total CHOL/HDL Ratio: 3.6 (calc) (ref <5.0)
Triglycerides: 149 mg/dL (ref <150)

## 2018-08-20 LAB — CBC WITH DIFFERENTIAL/PLATELET
Absolute Monocytes: 494 cells/uL (ref 200–950)
Basophils Absolute: 20 cells/uL (ref 0–200)
Basophils Relative: 0.3 %
Eosinophils Absolute: 130 cells/uL (ref 15–500)
Eosinophils Relative: 2 %
HCT: 40.9 % (ref 35.0–45.0)
Hemoglobin: 14 g/dL (ref 11.7–15.5)
Lymphs Abs: 1534 cells/uL (ref 850–3900)
MCH: 30.3 pg (ref 27.0–33.0)
MCHC: 34.2 g/dL (ref 32.0–36.0)
MCV: 88.5 fL (ref 80.0–100.0)
MPV: 9.2 fL (ref 7.5–12.5)
Monocytes Relative: 7.6 %
Neutro Abs: 4323 cells/uL (ref 1500–7800)
Neutrophils Relative %: 66.5 %
Platelets: 306 10*3/uL (ref 140–400)
RBC: 4.62 10*6/uL (ref 3.80–5.10)
RDW: 12.1 % (ref 11.0–15.0)
Total Lymphocyte: 23.6 %
WBC: 6.5 10*3/uL (ref 3.8–10.8)

## 2018-08-20 LAB — COMPREHENSIVE METABOLIC PANEL
AG Ratio: 1.6 (calc) (ref 1.0–2.5)
ALT: 18 U/L (ref 6–29)
AST: 13 U/L (ref 10–30)
Albumin: 4.5 g/dL (ref 3.6–5.1)
Alkaline phosphatase (APISO): 57 U/L (ref 31–125)
BUN: 15 mg/dL (ref 7–25)
CO2: 26 mmol/L (ref 20–32)
Calcium: 9.5 mg/dL (ref 8.6–10.2)
Chloride: 105 mmol/L (ref 98–110)
Creat: 0.7 mg/dL (ref 0.50–1.10)
Globulin: 2.9 g/dL (calc) (ref 1.9–3.7)
Glucose, Bld: 110 mg/dL — ABNORMAL HIGH (ref 65–99)
Potassium: 4.5 mmol/L (ref 3.5–5.3)
Sodium: 138 mmol/L (ref 135–146)
Total Bilirubin: 0.6 mg/dL (ref 0.2–1.2)
Total Protein: 7.4 g/dL (ref 6.1–8.1)

## 2018-08-20 LAB — TEST AUTHORIZATION

## 2018-08-20 LAB — HEMOGLOBIN A1C W/OUT EAG: Hgb A1c MFr Bld: 5.4 % of total Hgb (ref <5.7)

## 2018-09-03 NOTE — Telephone Encounter (Signed)
I spoke with patient and read her [place] My Chart email about her results.

## 2018-10-24 ENCOUNTER — Other Ambulatory Visit: Payer: Self-pay

## 2018-10-27 ENCOUNTER — Other Ambulatory Visit: Payer: Self-pay

## 2018-10-27 ENCOUNTER — Encounter: Payer: Self-pay | Admitting: Gynecology

## 2018-10-27 ENCOUNTER — Ambulatory Visit (INDEPENDENT_AMBULATORY_CARE_PROVIDER_SITE_OTHER): Payer: 59 | Admitting: Gynecology

## 2018-10-27 VITALS — BP 122/80

## 2018-10-27 DIAGNOSIS — Z30433 Encounter for removal and reinsertion of intrauterine contraceptive device: Secondary | ICD-10-CM

## 2018-10-27 HISTORY — PX: INTRAUTERINE DEVICE INSERTION: SHX323

## 2018-10-27 NOTE — Progress Notes (Signed)
.      Jaime Klein Nov 05, 1975 [id number]        43 y.o.  G1P1  presents for Mirena IUD replacement. She has read through the booklet, has no contraindications and signed the consent form.  She is at the 5-year mark with her current Mirena IUD.  She has no menses with the IUD.  I reviewed the removal and insertional process with her as well as the risks to include infection, either immediate or long-term, uterine perforation or migration requiring surgery to remove, other complications such as pain, hormonal side effects and possibility of failure with subsequent pregnancy.   Exam with Caryn Bee assistant Vitals:   10/27/18 1501  BP: 122/80    Pelvic: External BUS vagina normal. Cervix normal with IUD string visualized. Uterus axial to anteverted normal size shape contour midline mobile nontender. Adnexa without masses or tenderness.  Procedure: The cervix was visualized with a speculum and the old Mirena IUD string was grasped with a [place] forcep, the IUD removed, shown to the patient and discarded.  The cervix was then cleansed with Betadine, anterior lip grasped with a single-tooth tenaculum, the uterus was sounded and a Mirena IUD was placed according to manufacturer's recommendations without difficulty. The strings were trimmed. The patient tolerated well and will follow up in one month for a postinsertional check.  Lot number:  [id number]    Anastasio Auerbach MD, 3:18 PM 10/27/2018

## 2018-10-27 NOTE — Patient Instructions (Signed)

## 2018-11-06 ENCOUNTER — Other Ambulatory Visit: Payer: Self-pay | Admitting: Women's Health

## 2018-11-06 DIAGNOSIS — Z1231 Encounter for screening mammogram for malignant neoplasm of breast: Secondary | ICD-10-CM

## 2018-11-24 ENCOUNTER — Encounter: Payer: Self-pay | Admitting: Women's Health

## 2018-11-24 ENCOUNTER — Ambulatory Visit (INDEPENDENT_AMBULATORY_CARE_PROVIDER_SITE_OTHER): Payer: 59 | Admitting: Women's Health

## 2018-11-24 ENCOUNTER — Other Ambulatory Visit: Payer: Self-pay

## 2018-11-24 VITALS — BP 118/80

## 2018-11-24 DIAGNOSIS — Z30431 Encounter for routine checking of intrauterine contraceptive device: Secondary | ICD-10-CM

## 2018-11-24 NOTE — Progress Notes (Signed)
43 y.o. MWF G1P1+3 adopted presents for IUD placement check. Previously had Mirena inserted for 5 years, which was removed and replaced on 10/27/18 per Dr Phineas Real.  Had been amenorrheic, had one cycle since insertion . Denies pain, signs of infection, and other issues related to IUD.    Exam: Appears well. Pelvic: External vagina normal. Cervix normal with IUD strings visualized. String length appropriate.   IUD Check   Plan: Discussed string checks, signs of infection, aware it is good for 5 years. Patient is advised to return to clinic if any issues occur.

## 2018-12-29 ENCOUNTER — Ambulatory Visit
Admission: RE | Admit: 2018-12-29 | Discharge: 2018-12-29 | Disposition: A | Discharge status: Home / Self Care | Payer: 59 | Source: Ambulatory Visit | Attending: Women's Health | Admitting: Women's Health

## 2018-12-29 ENCOUNTER — Other Ambulatory Visit: Payer: Self-pay

## 2018-12-29 DIAGNOSIS — Z1231 Encounter for screening mammogram for malignant neoplasm of breast: Secondary | ICD-10-CM

## 2019-01-13 ENCOUNTER — Other Ambulatory Visit: Payer: Self-pay

## 2019-01-13 DIAGNOSIS — F4322 Adjustment disorder with anxiety: Secondary | ICD-10-CM

## 2019-01-13 MED ORDER — FLUOXETINE HCL 20 MG PO CAPS: ORAL_CAPSULE | ORAL | 2 refills | Status: DC

## 2019-02-16 ENCOUNTER — Other Ambulatory Visit: Payer: Self-pay | Admitting: *Deleted

## 2019-02-16 MED ORDER — BETAMETHASONE VALERATE 0.1 % EX OINT: 1.0000 "application " | TOPICAL_OINTMENT | Freq: Two times a day (BID) | CUTANEOUS | 0 refills | Status: DC

## 2019-02-16 NOTE — Telephone Encounter (Signed)
Okay for refill?  

## 2019-04-17 ENCOUNTER — Ambulatory Visit (INDEPENDENT_AMBULATORY_CARE_PROVIDER_SITE_OTHER)
Admission: RE | Admit: 2019-04-17 | Discharge: 2019-04-17 | Disposition: A | Discharge status: Home / Self Care | Payer: 59 | Source: Ambulatory Visit

## 2019-04-17 DIAGNOSIS — N3090 Cystitis, unspecified without hematuria: Secondary | ICD-10-CM

## 2019-04-17 DIAGNOSIS — N898 Other specified noninflammatory disorders of vagina: Secondary | ICD-10-CM | POA: Diagnosis not present

## 2019-04-17 DIAGNOSIS — R1030 Lower abdominal pain, unspecified: Secondary | ICD-10-CM

## 2019-04-17 MED ORDER — CEPHALEXIN 500 MG PO CAPS: 500.0000 mg | ORAL_CAPSULE | Freq: Two times a day (BID) | ORAL | 0 refills | Status: AC

## 2019-04-17 NOTE — Discharge Instructions (Signed)
Take the antibiotic as directed.    Follow up with your primary care provider or come here to be seen in person if your symptoms are not improving.    Go to the emergency department if you have acute abdominal pain or other concerning symptoms.

## 2019-04-17 NOTE — ED Provider Notes (Signed)
Virtual Visit via Video Note:  Jaime Klein  initiated request for Telemedicine visit with [facility] Urgent Care team. I connected with Jaime Klein  on 04/17/2019 at 11:11 AM  for a synchronized telemedicine visit using a video enabled HIPPA compliant telemedicine application. I verified that I am speaking with Jaime Klein  using two identifiers. Mickie Bail, NP  was physically located in a [facility] Urgent care site and Jaime Klein was located at a different location.   The limitations of evaluation and management by telemedicine as well as the availability of in-person appointments were discussed. Patient was informed that she  may incur a bill ( including co-pay) for this virtual visit encounter. Jaime Klein  expressed understanding and gave verbal consent to proceed with virtual visit.     History of Present Illness:Jaime Klein  is a 44 y.o. female presents for evaluation of dysuria and urinary frequency x 3 days.  She also reports light yellow vaginal discharge and mild lower abdominal pain since yesterday.  She denies fever, chills, back pain, pelvic pain, rash, lesions, or other symptoms.  She is sexually active with her husband in a monogamous relationship.  She denies pregnancy or breastfeeding.     Allergies  Allergen Reactions  . Sulfa Antibiotics      History reviewed. No pertinent past medical history.   Social History   Tobacco Use  . Smoking status: Former Games developer  . Smokeless tobacco: Never Used  Substance Use Topics  . Alcohol use: Yes    Alcohol/week: 0.0 standard drinks    Comment: social  . Drug use: No   ROS: as stated in HPI.  All other systems reviewed and negative.      Observations/Objective: Physical Exam  VITALS: Patient denies fever. GENERAL: Alert, appears well and in no acute distress. HEENT: Atraumatic. NECK: Normal movements of the head and neck. CARDIOPULMONARY: No increased WOB. Speaking in clear sentences. I:E ratio WNL.  MS:  Moves all visible extremities without noticeable abnormality. PSYCH: Pleasant and cooperative, well-groomed. Speech normal rate and rhythm. Affect is appropriate. Insight and judgement are appropriate. Attention is focused, linear, and appropriate.  NEURO: CN grossly intact. Oriented as arrived to appointment on time with no prompting. Moves both UE equally.  SKIN: No obvious lesions, wounds, erythema, or cyanosis noted on face or hands.   Assessment and Plan:    ICD-10-CM   1. Cystitis  N30.90   2. Vaginal discharge  N89.8   3. Lower abdominal pain  R10.30        Follow Up Instructions: Patient declines in person evaluation at this time.  Treating with Keflex.  Discussed with patient that she will need in person evaluation if her symptoms persist or worsen.  Instructed her to go to the ED if she has acute abdominal pain or other concerning symptoms.  Patient agrees to plan of care.      I discussed the assessment and treatment plan with the patient. The patient was provided an opportunity to ask questions and all were answered. The patient agreed with the plan and demonstrated an understanding of the instructions.   The patient was advised to call back or seek an in-person evaluation if the symptoms worsen or if the condition fails to improve as anticipated.      Mickie Bail, NP  04/17/2019 11:11 AM         Mickie Bail, NP 04/17/19 1112

## 2019-05-29 IMAGING — MG DIGITAL SCREENING BILATERAL MAMMOGRAM WITH TOMO AND CAD
1 series · 2 of 5 positions shown · non-contrast
Comparison: Previous exam(s).

ACR Breast Density Category a: The breast tissue is almost entirely
fatty.

CLINICAL DATA: Screening.

EXAM:
DIGITAL SCREENING BILATERAL MAMMOGRAM WITH TOMO AND CAD
The patient has retropectoral implants. Standard and implant
displaced views were performed.

[L MLO tomo · 2 of 79 frames shown]
[frame 26/79]
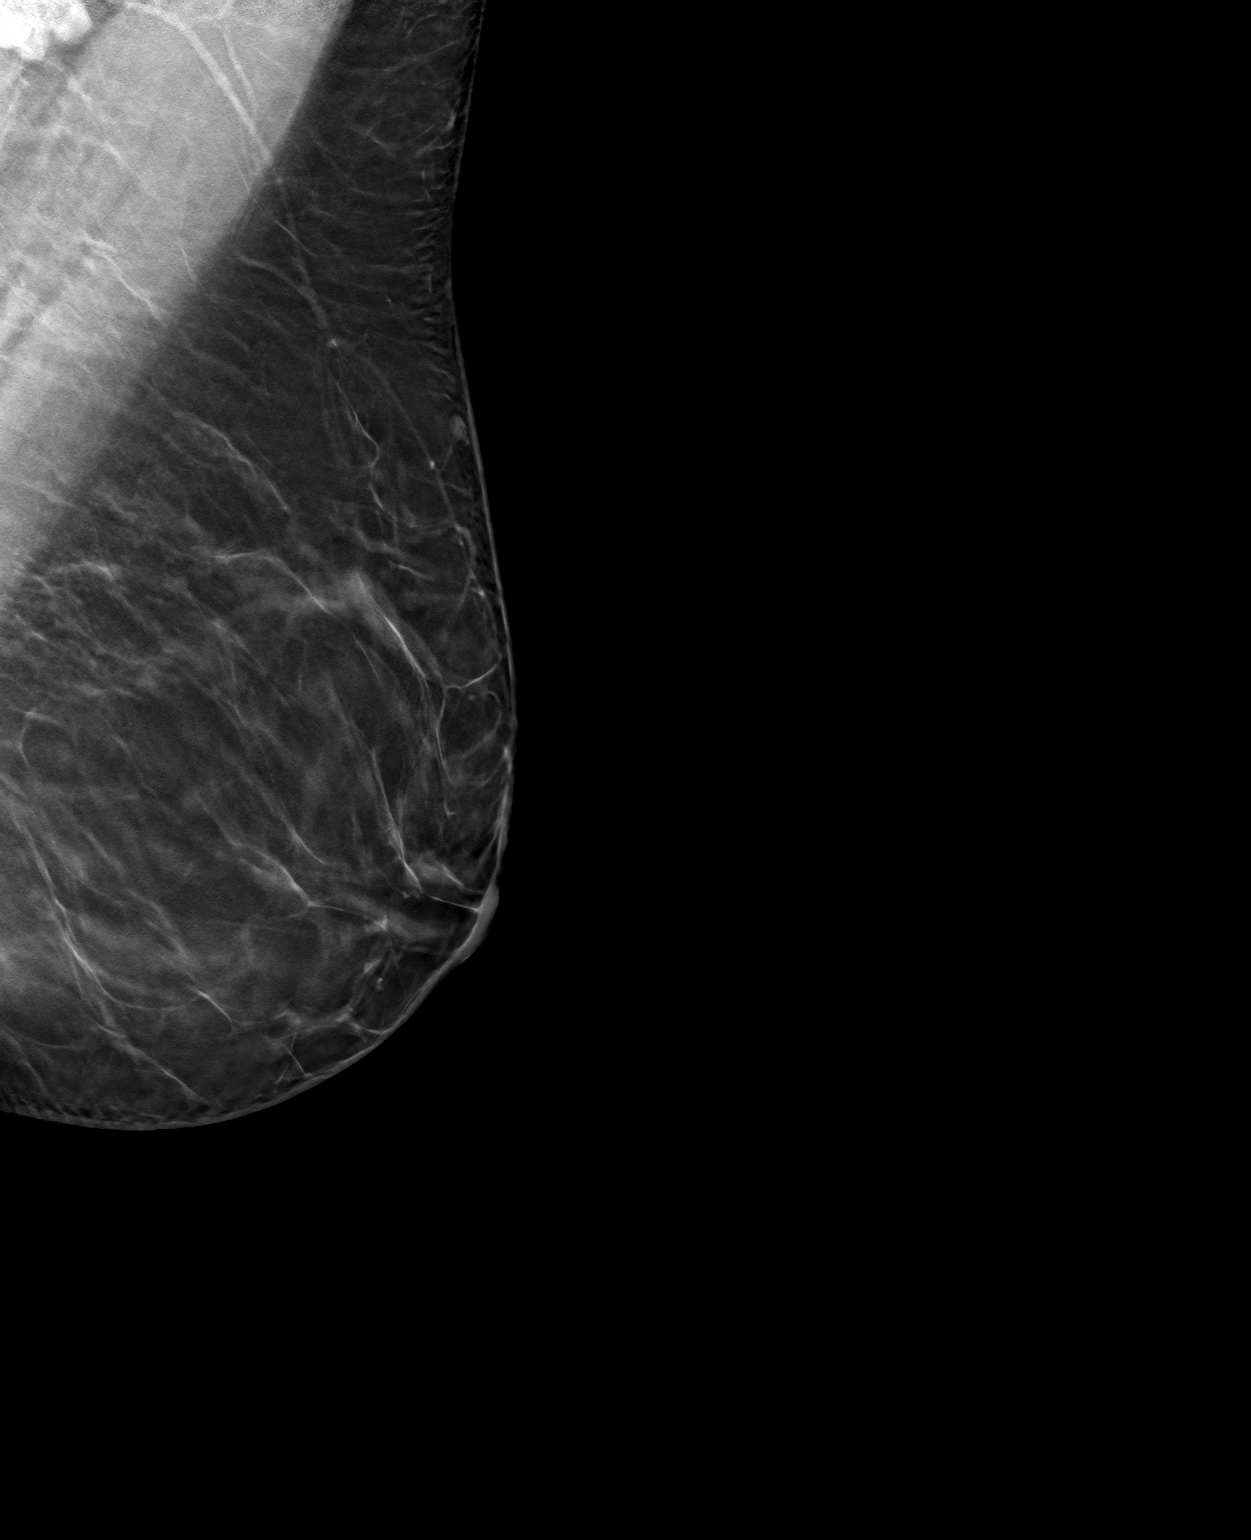
[frame 40/79]
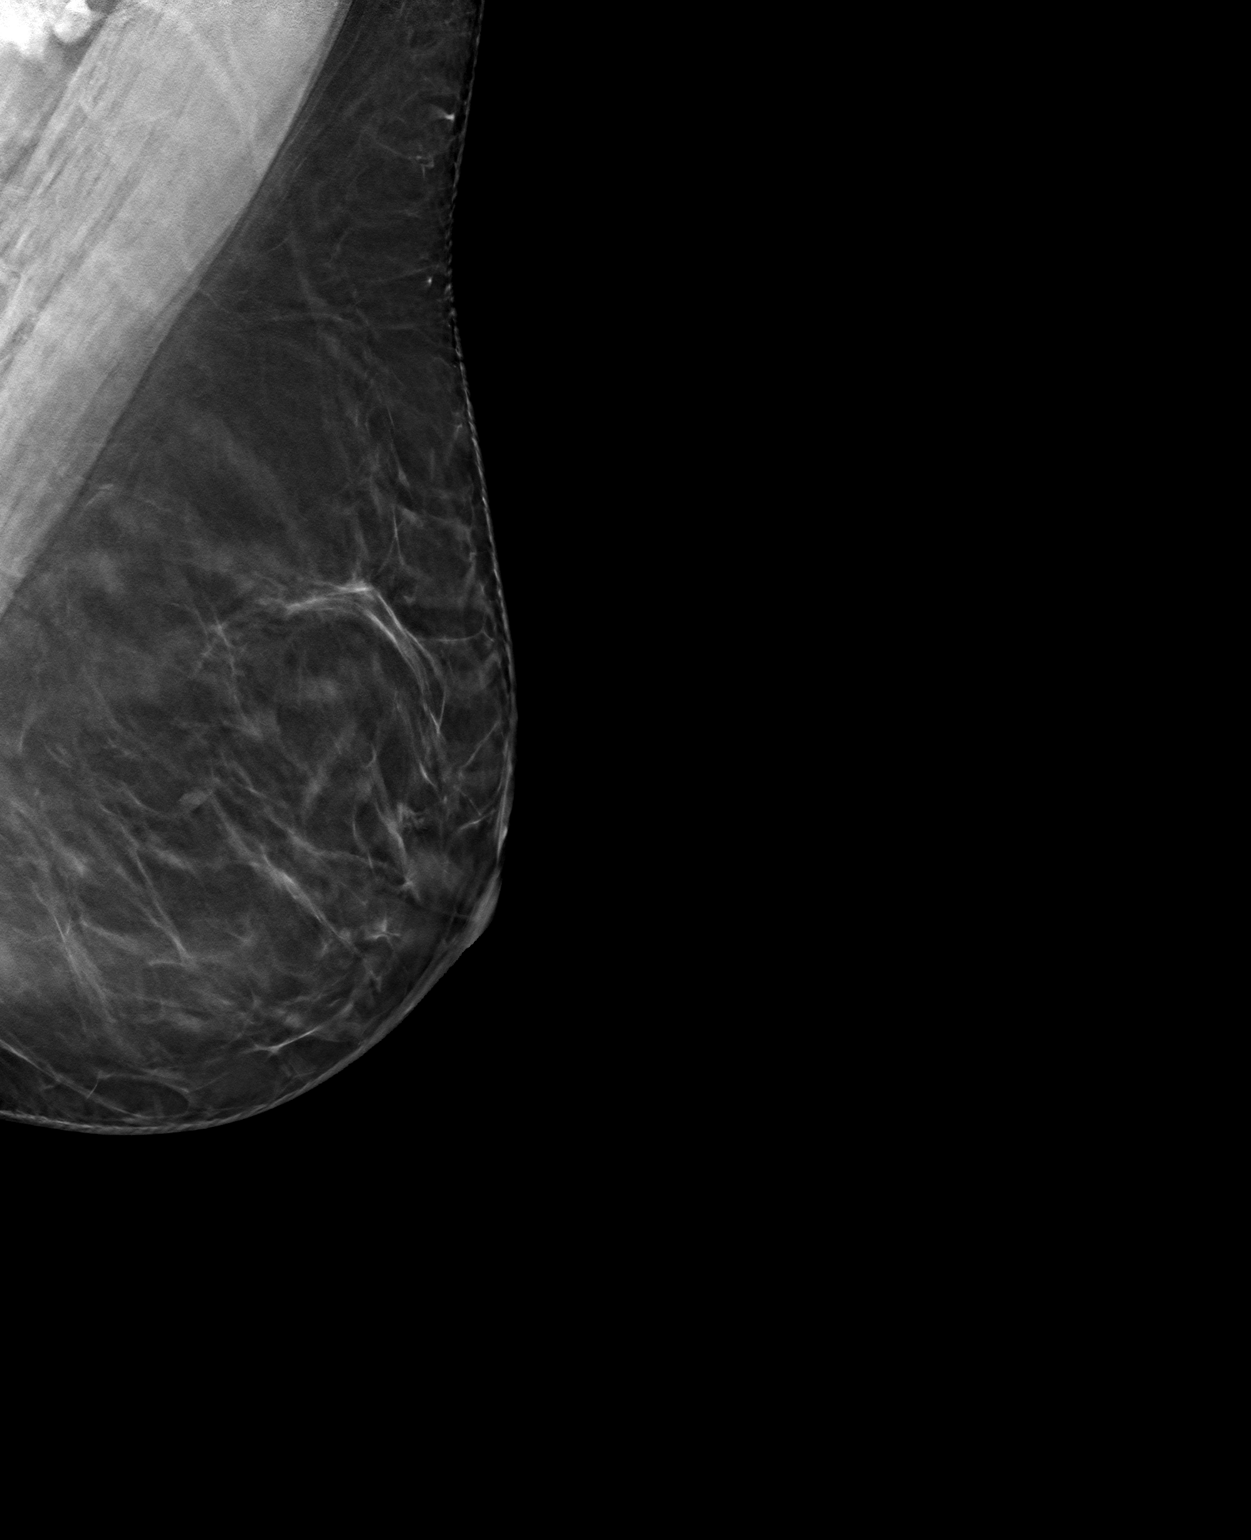

[2 of 5 positions shown; findings below may reference images not displayed]

FINDINGS: There are no findings suspicious for malignancy. Images were
processed with CAD.
IMPRESSION: No mammographic evidence of malignancy. A result letter of this
screening mammogram will be mailed directly to the patient.

RECOMMENDATION:
Screening mammogram in one year. (Code:U9-H-7IS)

BI-RADS CATEGORY  1:  Negative.

## 2019-08-24 ENCOUNTER — Encounter: Payer: 59 | Admitting: Nurse Practitioner

## 2019-09-28 ENCOUNTER — Ambulatory Visit (INDEPENDENT_AMBULATORY_CARE_PROVIDER_SITE_OTHER): Payer: 59 | Admitting: Nurse Practitioner

## 2019-09-28 ENCOUNTER — Other Ambulatory Visit: Payer: Self-pay

## 2019-09-28 ENCOUNTER — Encounter: Payer: Self-pay | Admitting: Nurse Practitioner

## 2019-09-28 VITALS — BP 124/80 | Ht 65.0 in | Wt 200.0 lb

## 2019-09-28 DIAGNOSIS — F418 Other specified anxiety disorders: Secondary | ICD-10-CM | POA: Diagnosis not present

## 2019-09-28 DIAGNOSIS — Z975 Presence of (intrauterine) contraceptive device: Secondary | ICD-10-CM | POA: Diagnosis not present

## 2019-09-28 DIAGNOSIS — Z01419 Encounter for gynecological examination (general) (routine) without abnormal findings: Secondary | ICD-10-CM

## 2019-09-28 DIAGNOSIS — Z0184 Encounter for antibody response examination: Secondary | ICD-10-CM

## 2019-09-28 DIAGNOSIS — L409 Psoriasis, unspecified: Secondary | ICD-10-CM | POA: Diagnosis not present

## 2019-09-28 MED ORDER — TRIAMCINOLONE ACETONIDE 0.1 % EX CREA: 1.0000 "application " | TOPICAL_CREAM | Freq: Two times a day (BID) | CUTANEOUS | 1 refills | Status: DC

## 2019-09-28 NOTE — Progress Notes (Signed)
° °  Jaime Klein 1975/12/25 [id number]   History:  44 y.o. G1P1 presents for annual exam. Amenorrheic/IUD placed 10/2018. Normal pap and mammogram history. Anxiety stable on Prozac. She would like Covid antibody testing done. Uses Kenalog cream on dry patches on lower legs.   Gynecologic History Patient's last menstrual period was 09/18/2019. Period Cycle (Days): 28 Period Duration (Days): 5 Period Pattern: Regular Menstrual Flow: Moderate Menstrual Control: Maxi pad, Tampon Dysmenorrhea: (!) Mild Contraception: IUD Last Pap: 08/18/2018. Results were: normal Last mammogram: 12/29/2018. Results were: normal   Past medical history, past surgical history, family history and social history were all reviewed and documented in the EPIC chart.  ROS:  A ROS was performed and pertinent positives and negatives are included.  Exam:  Vitals:   09/28/19 1043  BP: 124/80  Weight: 200 lb (90.7 kg)  Height: 5\' 5"  (1.651 m)   Body mass index is 33.28 kg/m.  General appearance:  Normal Thyroid:  Symmetrical, normal in size, without palpable masses or nodularity. Respiratory  Auscultation:  Clear without wheezing or rhonchi Cardiovascular  Auscultation:  Regular rate, without rubs, murmurs or gallops  Edema/varicosities:  Not grossly evident Abdominal  Soft,nontender, without masses, guarding or rebound.  Liver/spleen:  No organomegaly noted  Hernia:  None appreciated  Skin  Inspection:  Small dry patches on lower legs   Breasts: Examined lying and sitting.   Right: Without masses, retractions, discharge or axillary adenopathy.   Left: Without masses, retractions, discharge or axillary adenopathy. Gentitourinary   Inguinal/mons:  Normal without inguinal adenopathy  External genitalia:  Normal  BUS/Urethra/Skene's glands:  Normal  Vagina:  Normal  Cervix:  Normal, IUD string visible  Uterus:  Difficult to palpate due to body habitus but no gross masses or tenderness.    Adnexa/parametria:     Rt: Without masses or tenderness.   Lt: Without masses or tenderness.  Anus and perineum: Normal   Assessment/Plan:  44 y.o. G1P1 for annual exam.   Well female exam with routine gynecological exam - Plan: CBC with Differential/Platelet, Comprehensive metabolic panel, Lipid panel. Education provided on SBEs, importance of preventative screenings, current guidelines, high calcium diet, regular exercise, and multivitamin daily.   IUD (intrauterine device) in place - placed 10/2018. Occasional light spotting.   Anxiety with depression - Doing well on Prozac 20 mg daily. Would like to continue.   Psoriasis - Plan: triamcinolone cream (KENALOG) 0.1 % twice a day to dry areas on legs.   COVID-19 virus IgG antibody test result unknown - Plan: SAR CoV2 Serology (COVID 19)AB(IGG)IA  Follow-up in 1 year for annual        11/2018 [facility], 10:53 AM 09/28/2019

## 2019-09-28 NOTE — Patient Instructions (Signed)
Health Maintenance, Female Adopting a healthy lifestyle and getting preventive care are important in promoting health and wellness. Ask your health care provider about:  The right schedule for you to have regular tests and exams.  Things you can do on your own to prevent diseases and keep yourself healthy. What should I know about diet, weight, and exercise? Eat a healthy diet   Eat a diet that includes plenty of vegetables, fruits, low-fat dairy products, and lean protein.  Do not eat a lot of foods that are high in solid fats, added sugars, or sodium. Maintain a healthy weight Body mass index (BMI) is used to identify weight problems. It estimates body fat based on height and weight. Your health care provider can help determine your BMI and help you achieve or maintain a healthy weight. Get regular exercise Get regular exercise. This is one of the most important things you can do for your health. Most adults should:  Exercise for at least 150 minutes each week. The exercise should increase your heart rate and make you sweat (moderate-intensity exercise).  Do strengthening exercises at least twice a week. This is in addition to the moderate-intensity exercise.  Spend less time sitting. Even light physical activity can be beneficial. Watch cholesterol and blood lipids Have your blood tested for lipids and cholesterol at 44 years of age, then have this test every 5 years. Have your cholesterol levels checked more often if:  Your lipid or cholesterol levels are high.  You are older than 44 years of age.  You are at high risk for heart disease. What should I know about cancer screening? Depending on your health history and family history, you may need to have cancer screening at various ages. This may include screening for:  Breast cancer.  Cervical cancer.  Colorectal cancer.  Skin cancer.  Lung cancer. What should I know about heart disease, diabetes, and high blood  pressure? Blood pressure and heart disease  High blood pressure causes heart disease and increases the risk of stroke. This is more likely to develop in people who have high blood pressure readings, are of African descent, or are overweight.  Have your blood pressure checked: ? Every 3-5 years if you are 18-39 years of age. ? Every year if you are 40 years old or older. Diabetes Have regular diabetes screenings. This checks your fasting blood sugar level. Have the screening done:  Once every three years after age 40 if you are at a normal weight and have a low risk for diabetes.  More often and at a younger age if you are overweight or have a high risk for diabetes. What should I know about preventing infection? Hepatitis B If you have a higher risk for hepatitis B, you should be screened for this virus. Talk with your health care provider to find out if you are at risk for hepatitis B infection. Hepatitis C Testing is recommended for:  Everyone born from 1945 through 1965.  Anyone with known risk factors for hepatitis C. Sexually transmitted infections (STIs)  Get screened for STIs, including gonorrhea and chlamydia, if: ? You are sexually active and are younger than 44 years of age. ? You are older than 44 years of age and your health care provider tells you that you are at risk for this type of infection. ? Your sexual activity has changed since you were last screened, and you are at increased risk for chlamydia or gonorrhea. Ask your health care provider if   you are at risk.  Ask your health care provider about whether you are at high risk for HIV. Your health care provider may recommend a prescription medicine to help prevent HIV infection. If you choose to take medicine to prevent HIV, you should first get tested for HIV. You should then be tested every 3 months for as long as you are taking the medicine. Pregnancy  If you are about to stop having your period (premenopausal) and  you may become pregnant, seek counseling before you get pregnant.  Take 400 to 800 micrograms (mcg) of folic acid every day if you become pregnant.  Ask for birth control (contraception) if you want to prevent pregnancy. Osteoporosis and menopause Osteoporosis is a disease in which the bones lose minerals and strength with aging. This can result in bone fractures. If you are 65 years old or older, or if you are at risk for osteoporosis and fractures, ask your health care provider if you should:  Be screened for bone loss.  Take a calcium or vitamin D supplement to lower your risk of fractures.  Be given hormone replacement therapy (HRT) to treat symptoms of menopause. Follow these instructions at home: Lifestyle  Do not use any products that contain nicotine or tobacco, such as cigarettes, e-cigarettes, and chewing tobacco. If you need help quitting, ask your health care provider.  Do not use street drugs.  Do not share needles.  Ask your health care provider for help if you need support or information about quitting drugs. Alcohol use  Do not drink alcohol if: ? Your health care provider tells you not to drink. ? You are pregnant, may be pregnant, or are planning to become pregnant.  If you drink alcohol: ? Limit how much you use to 0-1 drink a day. ? Limit intake if you are breastfeeding.  Be aware of how much alcohol is in your drink. In the [place] one drink equals one 12 oz bottle of beer (355 mL), one 5 oz glass of wine (148 mL), or one 1 oz glass of hard liquor (44 mL). General instructions  Schedule regular health, dental, and eye exams.  Stay current with your vaccines.  Tell your health care provider if: ? You often feel depressed. ? You have ever been abused or do not feel safe at home. Summary  Adopting a healthy lifestyle and getting preventive care are important in promoting health and wellness.  Follow your health care provider's instructions about healthy  diet, exercising, and getting tested or screened for diseases.  Follow your health care provider's instructions on monitoring your cholesterol and blood pressure. This information is not intended to replace advice given to you by your health care provider. Make sure you discuss any questions you have with your health care provider. Document Revised: 12/18/2017 Document Reviewed: 12/18/2017 Elsevier Patient Education  2020 Elsevier Inc.  

## 2019-09-29 LAB — COMPREHENSIVE METABOLIC PANEL
AG Ratio: 1.8 (calc) (ref 1.0–2.5)
ALT: 12 U/L (ref 6–29)
AST: 12 U/L (ref 10–30)
Albumin: 4.2 g/dL (ref 3.6–5.1)
Alkaline phosphatase (APISO): 57 U/L (ref 31–125)
BUN: 10 mg/dL (ref 7–25)
CO2: 26 mmol/L (ref 20–32)
Calcium: 9 mg/dL (ref 8.6–10.2)
Chloride: 106 mmol/L (ref 98–110)
Creat: 0.59 mg/dL (ref 0.50–1.10)
Globulin: 2.3 g/dL (calc) (ref 1.9–3.7)
Glucose, Bld: 98 mg/dL (ref 65–99)
Potassium: 4.6 mmol/L (ref 3.5–5.3)
Sodium: 140 mmol/L (ref 135–146)
Total Bilirubin: 0.4 mg/dL (ref 0.2–1.2)
Total Protein: 6.5 g/dL (ref 6.1–8.1)

## 2019-09-29 LAB — CBC WITH DIFFERENTIAL/PLATELET
Absolute Monocytes: 485 cells/uL (ref 200–950)
Basophils Absolute: 19 cells/uL (ref 0–200)
Basophils Relative: 0.3 %
Eosinophils Absolute: 151 cells/uL (ref 15–500)
Eosinophils Relative: 2.4 %
HCT: 39.6 % (ref 35.0–45.0)
Hemoglobin: 13.5 g/dL (ref 11.7–15.5)
Lymphs Abs: 1789 cells/uL (ref 850–3900)
MCH: 30.5 pg (ref 27.0–33.0)
MCHC: 34.1 g/dL (ref 32.0–36.0)
MCV: 89.6 fL (ref 80.0–100.0)
MPV: 9.3 fL (ref 7.5–12.5)
Monocytes Relative: 7.7 %
Neutro Abs: 3856 cells/uL (ref 1500–7800)
Neutrophils Relative %: 61.2 %
Platelets: 330 10*3/uL (ref 140–400)
RBC: 4.42 10*6/uL (ref 3.80–5.10)
RDW: 12.4 % (ref 11.0–15.0)
Total Lymphocyte: 28.4 %
WBC: 6.3 10*3/uL (ref 3.8–10.8)

## 2019-09-29 LAB — LIPID PANEL
Cholesterol: 176 mg/dL (ref <200)
HDL: 58 mg/dL (ref >=50)
LDL Cholesterol (Calc): 94 mg/dL (calc)
Non-HDL Cholesterol (Calc): 118 mg/dL (calc) (ref <130)
Total CHOL/HDL Ratio: 3 (calc) (ref <5.0)
Triglycerides: 137 mg/dL (ref <150)

## 2019-09-29 LAB — SARS-COV-2 ANTIBODY(IGG)SPIKE,SEMI-QUANTITATIVE: SARS COV1 AB(IGG)SPIKE,SEMI QN: <1 index (ref <1.00)

## 2019-09-30 ENCOUNTER — Telehealth: Payer: Self-pay | Admitting: *Deleted

## 2019-09-30 NOTE — Telephone Encounter (Signed)
[facility] pharmacy called to see if the triamcinolone cream 0.1% will be replacing the betamethasone ointment 01%, called and informed yes it will per office visit on 09/28/19 patient will using this medication.

## 2019-10-06 ENCOUNTER — Other Ambulatory Visit: Payer: Self-pay

## 2019-10-06 DIAGNOSIS — F4322 Adjustment disorder with anxiety: Secondary | ICD-10-CM

## 2019-10-06 MED ORDER — FLUOXETINE HCL 20 MG PO CAPS
ORAL_CAPSULE | ORAL | 2 refills | Status: DC
Start: 2019-10-06 — End: 2020-08-30

## 2019-10-06 NOTE — Telephone Encounter (Signed)
Mail order pharmacy requesting refill.

## 2019-10-13 ENCOUNTER — Other Ambulatory Visit: Payer: Self-pay | Admitting: Nurse Practitioner

## 2019-10-13 DIAGNOSIS — L409 Psoriasis, unspecified: Secondary | ICD-10-CM

## 2019-11-02 ENCOUNTER — Telehealth: Payer: Self-pay | Admitting: Nurse Practitioner

## 2019-11-02 DIAGNOSIS — U071 COVID-19: Secondary | ICD-10-CM

## 2019-11-02 NOTE — Telephone Encounter (Signed)
Called to discuss with Jaime Klein about Covid symptoms and the use of monoclonal antibody infusion for those with mild to moderate Covid symptoms and at a high risk of hospitalization.     Pt is qualified for this infusion at the [facility] infusion center due to co-morbid conditions and/or a member of an at-risk group, however declines infusion at this time. Patient, her husband, and her parents are positive and are in the process of setting up appointments at [facility] site. I advised to call back if they'd like to consider treatment closer to home.   Consuello Masse, NP [phone] [email or web address]

## 2020-03-02 ENCOUNTER — Other Ambulatory Visit: Payer: Self-pay | Admitting: Nurse Practitioner

## 2020-03-02 DIAGNOSIS — Z1231 Encounter for screening mammogram for malignant neoplasm of breast: Secondary | ICD-10-CM

## 2020-04-11 ENCOUNTER — Other Ambulatory Visit: Payer: Self-pay

## 2020-04-11 ENCOUNTER — Ambulatory Visit
Admission: RE | Admit: 2020-04-11 | Discharge: 2020-04-11 | Disposition: A | Discharge status: Home / Self Care | Payer: 59 | Source: Ambulatory Visit

## 2020-04-11 DIAGNOSIS — Z1231 Encounter for screening mammogram for malignant neoplasm of breast: Secondary | ICD-10-CM

## 2020-04-12 ENCOUNTER — Other Ambulatory Visit: Payer: Self-pay | Admitting: Nurse Practitioner

## 2020-04-12 DIAGNOSIS — R928 Other abnormal and inconclusive findings on diagnostic imaging of breast: Secondary | ICD-10-CM

## 2020-05-02 ENCOUNTER — Other Ambulatory Visit: Payer: Self-pay

## 2020-05-02 ENCOUNTER — Other Ambulatory Visit: Payer: Self-pay | Admitting: Nurse Practitioner

## 2020-05-02 ENCOUNTER — Ambulatory Visit
Admission: RE | Admit: 2020-05-02 | Discharge: 2020-05-02 | Disposition: A | Discharge status: Home / Self Care | Payer: 59 | Source: Ambulatory Visit | Attending: Nurse Practitioner | Admitting: Nurse Practitioner

## 2020-05-02 DIAGNOSIS — N6320 Unspecified lump in the left breast, unspecified quadrant: Secondary | ICD-10-CM

## 2020-05-02 DIAGNOSIS — R928 Other abnormal and inconclusive findings on diagnostic imaging of breast: Secondary | ICD-10-CM

## 2020-08-26 ENCOUNTER — Other Ambulatory Visit: Payer: Self-pay | Admitting: Nurse Practitioner

## 2020-08-26 DIAGNOSIS — F4322 Adjustment disorder with anxiety: Secondary | ICD-10-CM

## 2020-08-30 NOTE — Telephone Encounter (Signed)
Patient scheduled on 09/28/20

## 2020-09-06 ENCOUNTER — Other Ambulatory Visit: Payer: Self-pay | Admitting: Nurse Practitioner

## 2020-09-06 DIAGNOSIS — L409 Psoriasis, unspecified: Secondary | ICD-10-CM

## 2020-09-28 ENCOUNTER — Ambulatory Visit: Payer: 59 | Admitting: Nurse Practitioner

## 2020-10-24 ENCOUNTER — Encounter: Payer: Self-pay | Admitting: Nurse Practitioner

## 2020-10-24 ENCOUNTER — Ambulatory Visit (INDEPENDENT_AMBULATORY_CARE_PROVIDER_SITE_OTHER): Payer: 59 | Admitting: Nurse Practitioner

## 2020-10-24 ENCOUNTER — Other Ambulatory Visit: Payer: Self-pay

## 2020-10-24 VITALS — BP 118/76 | Ht 65.0 in | Wt 207.0 lb

## 2020-10-24 DIAGNOSIS — Z30431 Encounter for routine checking of intrauterine contraceptive device: Secondary | ICD-10-CM | POA: Diagnosis not present

## 2020-10-24 DIAGNOSIS — Z01419 Encounter for gynecological examination (general) (routine) without abnormal findings: Secondary | ICD-10-CM

## 2020-10-24 DIAGNOSIS — F4322 Adjustment disorder with anxiety: Secondary | ICD-10-CM | POA: Diagnosis not present

## 2020-10-24 MED ORDER — FLUOXETINE HCL 20 MG PO CAPS: ORAL_CAPSULE | ORAL | 3 refills | Status: DC

## 2020-10-24 NOTE — Progress Notes (Signed)
Jaime Klein 05-Aug-1975 [id number]   History:  45 y.o. G1P1 presents for annual exam. Amenorrheic/Mirena IUD placed 10/2018. Normal pap history. Anxiety stable on Prozac.   Gynecologic History No LMP recorded. (Menstrual status: IUD).   Contraception: IUD Sexually active: Yes  Health Maintenance Last Pap: 08/18/2018. Results were: Normal, 5-year repeat Last mammogram: 05/02/2020. Results were: Probable benign mass in the left breast at 9 o'clock measuring 0.5 cm, likely a cluster of cysts. 6 month follow up scheduled 8/31 Last colonoscopy: Never Last Dexa: Not indicated  Past medical history, past surgical history, family history and social history were all reviewed and documented in the EPIC chart. 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   ROS:  A ROS was performed and pertinent positives and negatives are included.  Exam:  Vitals:   10/24/20 1511  BP: 118/76  Weight: 207 lb (93.9 kg)  Height: 5\' 5"  (1.651 m)    Body mass index is 34.45 kg/m.  General appearance:  Normal Thyroid:  Symmetrical, normal in size, without palpable masses or nodularity. Respiratory  Auscultation:  Clear without wheezing or rhonchi Cardiovascular  Auscultation:  Regular rate, without rubs, murmurs or gallops  Edema/varicosities:  Not grossly evident Abdominal  Soft,nontender, without masses, guarding or rebound.  Liver/spleen:  No organomegaly noted  Hernia:  None appreciated  Skin  Inspection:  Small dry patches on lower legs   Breasts: Examined lying and sitting.   Right: Without masses, retractions, discharge or axillary adenopathy.   Left: Without masses, retractions, discharge or axillary adenopathy. Genitourinary   Inguinal/mons:  Normal without inguinal adenopathy  External genitalia:  Normal appearing vulva with no masses, tenderness, or lesions  BUS/Urethra/Skene's glands:  Normal  Vagina:  Normal appearing with normal color and discharge, no lesions  Cervix:  Normal appearing  without discharge or lesions. IUD string visible  Uterus:  Difficult to palpate due to body habitus but no gross masses or tenderness  Adnexa/parametria:     Rt: Normal in size, without masses or tenderness.   Lt: Normal in size, without masses or tenderness.  Anus and perineum: Normal  Digital rectal exam: Normal sphincter tone without palpated masses or tenderness  Patient informed chaperone available to be present for breast and pelvic exam. Patient has requested no chaperone to be present. Patient has been advised what will be completed during breast and pelvic exam.    Assessment/Plan:  45 y.o. G1P1 for annual exam.   Well female exam with routine gynecological exam - Plan: CBC with Differential/Platelet, Comprehensive metabolic panel. Education provided on SBEs, importance of preventative screenings, current guidelines, high calcium diet, regular exercise, and multivitamin daily.   Encounter for routine checking of intrauterine contraceptive device (IUD) - Mirena IUD 10/2018. Amenorrheic. Aware of FDA approval for 8 years for pregnancy prevention.   Adjustment disorder with anxious mood - Plan: FLUoxetine (PROZAC) 20 MG capsule daily. Doing well on this and would like to continue. Refill x 1 year provided.   Screening for cervical cancer - Normal Pap history.  Will repeat at 5-year interval per guidelines.  Screening for breast cancer - 4/25/202 probable benign mass in the left breast at 9 o'clock measuring 0.5 cm, likely a cluster of cysts. 6 month follow up scheduled 09/07/2020. Normal breast exam today.  Screening for colon cancer - Discussed current guidelines and recommendations to start screening at age 1. Information provided on [facility].   Follow-up in 1 year for annual.        Jaime Klein  Jaime Klein [facility], 3:35 PM 10/24/2020

## 2020-10-24 NOTE — Patient Instructions (Signed)
Schedule Colonoscopy! ?[facility] ?[phone] ?[address] [place], [place] [address] ? ?

## 2020-11-07 ENCOUNTER — Other Ambulatory Visit: Payer: 59

## 2020-12-19 ENCOUNTER — Inpatient Hospital Stay: Admission: RE | Admit: 2020-12-19 | Payer: 59 | Source: Ambulatory Visit

## 2020-12-19 ENCOUNTER — Encounter: Payer: Self-pay | Admitting: Nurse Practitioner

## 2020-12-19 ENCOUNTER — Other Ambulatory Visit: Payer: 59

## 2020-12-19 DIAGNOSIS — N6320 Unspecified lump in the left breast, unspecified quadrant: Secondary | ICD-10-CM

## 2021-01-13 ENCOUNTER — Other Ambulatory Visit: Payer: Self-pay | Admitting: Nurse Practitioner

## 2021-01-13 DIAGNOSIS — F4322 Adjustment disorder with anxiety: Secondary | ICD-10-CM

## 2021-01-13 NOTE — Telephone Encounter (Signed)
Mail order requesting Rx, last annual exam on 10/2020

## 2021-01-24 ENCOUNTER — Encounter: Payer: Self-pay | Admitting: Nurse Practitioner

## 2021-10-30 ENCOUNTER — Ambulatory Visit: Payer: 59 | Admitting: Nurse Practitioner

## 2021-11-13 ENCOUNTER — Ambulatory Visit: Payer: 59 | Admitting: Nurse Practitioner

## 2021-11-27 ENCOUNTER — Encounter: Payer: Self-pay | Admitting: Nurse Practitioner

## 2021-11-27 ENCOUNTER — Ambulatory Visit (INDEPENDENT_AMBULATORY_CARE_PROVIDER_SITE_OTHER): Payer: 59 | Admitting: Nurse Practitioner

## 2021-11-27 VITALS — BP 124/82 | HR 81 | Ht 65.5 in | Wt 203.0 lb

## 2021-11-27 DIAGNOSIS — E785 Hyperlipidemia, unspecified: Secondary | ICD-10-CM

## 2021-11-27 DIAGNOSIS — Z01419 Encounter for gynecological examination (general) (routine) without abnormal findings: Secondary | ICD-10-CM | POA: Diagnosis not present

## 2021-11-27 DIAGNOSIS — Z1211 Encounter for screening for malignant neoplasm of colon: Secondary | ICD-10-CM

## 2021-11-27 DIAGNOSIS — F411 Generalized anxiety disorder: Secondary | ICD-10-CM

## 2021-11-27 DIAGNOSIS — Z30431 Encounter for routine checking of intrauterine contraceptive device: Secondary | ICD-10-CM | POA: Diagnosis not present

## 2021-11-27 MED ORDER — FLUOXETINE HCL 20 MG PO CAPS: 20.0000 mg | ORAL_CAPSULE | Freq: Every day | ORAL | 3 refills | Status: DC

## 2021-11-27 NOTE — Progress Notes (Signed)
Jaime Klein 1975/03/27 [id number]   History:  46 y.o. G1P1 presents for annual exam. Mirena IUD placed 10/2018, monthly spotting. Normal pap history. Anxiety stable on Prozac.   Gynecologic History No LMP recorded. (Menstrual status: IUD).   Contraception: IUD Sexually active: Yes  Health Maintenance Last Pap: 08/18/2018. Results were: Normal, 5-year repeat Last mammogram: 01/24/2021 (left diagnostic). Results were: Resolution of cyst/cluster of cysts. 43-month follow up was recommended Last colonoscopy: Never Last Dexa: Not indicated  Past medical history, past surgical history, family history and social history were all reviewed and documented in the EPIC chart. 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   ROS:  A ROS was performed and pertinent positives and negatives are included.  Exam:  Vitals:   11/27/21 0939  BP: 124/82  Pulse: 81  SpO2: 97%  Weight: 203 lb (92.1 kg)  Height: 5' 5.5" (1.664 m)     Body mass index is 33.27 kg/m.  General appearance:  Normal Thyroid:  Symmetrical, normal in size, without palpable masses or nodularity. Respiratory  Auscultation:  Clear without wheezing or rhonchi Cardiovascular  Auscultation:  Regular rate, without rubs, murmurs or gallops  Edema/varicosities:  Not grossly evident Abdominal  Soft,nontender, without masses, guarding or rebound.  Liver/spleen:  No organomegaly noted  Hernia:  None appreciated  Skin  Inspection:  Small dry patches on lower legs   Breasts: Examined lying and sitting.   Right: Without masses, retractions, discharge or axillary adenopathy.   Left: Without masses, retractions, discharge or axillary adenopathy. Genitourinary   Inguinal/mons:  Normal without inguinal adenopathy  External genitalia:  Normal appearing vulva with no masses, tenderness, or lesions  BUS/Urethra/Skene's glands:  Normal  Vagina:  Normal appearing with normal color and discharge, no lesions  Cervix:  Normal appearing without  discharge or lesions. IUD string visible  Uterus:  Difficult to palpate due to body habitus but no gross masses or tenderness  Adnexa/parametria:     Rt: Normal in size, without masses or tenderness.   Lt: Normal in size, without masses or tenderness.  Anus and perineum: Normal  Digital rectal exam: Normal sphincter tone without palpated masses or tenderness  Patient informed chaperone available to be present for breast and pelvic exam. Patient has requested no chaperone to be present. Patient has been advised what will be completed during breast and pelvic exam.    Assessment/Plan:  46 y.o. G1P1 for annual exam.   Well female exam with routine gynecological exam - Plan: CBC with Differential/Platelet, Comprehensive metabolic panel. Education provided on SBEs, importance of preventative screenings, current guidelines, high calcium diet, regular exercise, and multivitamin daily.   Encounter for routine checking of intrauterine contraceptive device (IUD) - Mirena IUD 10/2018. Light monthly spotting. Aware of 8-year FDA approval.   Adjustment disorder with anxious mood - Plan: FLUoxetine (PROZAC) 20 MG capsule daily. Doing well on this and would like to continue. Refill x 1 year provided.   Hyperlipidemia, unspecified hyperlipidemia type - Plan: Lipid panel  GAD (generalized anxiety disorder) - Plan: FLUoxetine (PROZAC) 20 MG capsule daily. Doing well on this and wants to continue.   Screening for colon cancer - Plan: Cologuard. Discussed current guidelines and importance of preventative screenings. Average risk. Cologuard versus colonoscopy discussed.   Screening for cervical cancer - Normal Pap history.  Will repeat at 5-year interval per guidelines.  Screening for breast cancer - 01/24/2021 (left diagnostic) showed resolution of cyst/cluster of cysts. 11-month follow up was recommended but not done by patient.  Recommend calling to schedule. Normal breast exam today.  Follow-up in 1 year  for annual.        Jaime Klein [facility], 10:42 AM 11/27/2021

## 2021-11-28 LAB — LIPID PANEL
Cholesterol: 169 mg/dL (ref <200)
HDL: 67 mg/dL (ref >=50)
LDL Cholesterol (Calc): 78 mg/dL (calc)
Non-HDL Cholesterol (Calc): 102 mg/dL (calc) (ref <130)
Total CHOL/HDL Ratio: 2.5 (calc) (ref <5.0)
Triglycerides: 144 mg/dL (ref <150)

## 2021-11-28 LAB — COMPREHENSIVE METABOLIC PANEL
AG Ratio: 1.6 (calc) (ref 1.0–2.5)
ALT: 10 U/L (ref 6–29)
AST: 9 U/L — ABNORMAL LOW (ref 10–35)
Albumin: 4.2 g/dL (ref 3.6–5.1)
Alkaline phosphatase (APISO): 68 U/L (ref 31–125)
BUN: 13 mg/dL (ref 7–25)
CO2: 27 mmol/L (ref 20–32)
Calcium: 9.2 mg/dL (ref 8.6–10.2)
Chloride: 104 mmol/L (ref 98–110)
Creat: 0.63 mg/dL (ref 0.50–0.99)
Globulin: 2.6 g/dL (calc) (ref 1.9–3.7)
Glucose, Bld: 94 mg/dL (ref 65–99)
Potassium: 4.8 mmol/L (ref 3.5–5.3)
Sodium: 139 mmol/L (ref 135–146)
Total Bilirubin: 0.3 mg/dL (ref 0.2–1.2)
Total Protein: 6.8 g/dL (ref 6.1–8.1)

## 2021-11-28 LAB — CBC WITH DIFFERENTIAL/PLATELET
Absolute Monocytes: 639 cells/uL (ref 200–950)
Basophils Absolute: 23 cells/uL (ref 0–200)
Basophils Relative: 0.3 %
Eosinophils Absolute: 193 cells/uL (ref 15–500)
Eosinophils Relative: 2.5 %
HCT: 39.7 % (ref 35.0–45.0)
Hemoglobin: 13.7 g/dL (ref 11.7–15.5)
Lymphs Abs: 1971 cells/uL (ref 850–3900)
MCH: 30.4 pg (ref 27.0–33.0)
MCHC: 34.5 g/dL (ref 32.0–36.0)
MCV: 88 fL (ref 80.0–100.0)
MPV: 9.6 fL (ref 7.5–12.5)
Monocytes Relative: 8.3 %
Neutro Abs: 4874 cells/uL (ref 1500–7800)
Neutrophils Relative %: 63.3 %
Platelets: 292 10*3/uL (ref 140–400)
RBC: 4.51 10*6/uL (ref 3.80–5.10)
RDW: 11.7 % (ref 11.0–15.0)
Total Lymphocyte: 25.6 %
WBC: 7.7 10*3/uL (ref 3.8–10.8)

## 2021-12-15 LAB — COLOGUARD

## 2022-01-13 LAB — COLOGUARD: COLOGUARD: NEGATIVE

## 2022-02-28 ENCOUNTER — Encounter: Payer: Self-pay | Admitting: Nurse Practitioner

## 2022-04-05 IMAGING — US US BREAST*L* LIMITED INC AXILLA
1 series · 13 of 13 positions shown · non-contrast
Comparison: Previous exam(s).

CLINICAL DATA: 45-year-old female presenting as a recall from
screening for possible left breast mass.

EXAM:
DIGITAL DIAGNOSTIC UNILATERAL LEFT MAMMOGRAM WITH TOMOSYNTHESIS AND
CAD; ULTRASOUND LEFT BREAST LIMITED
TECHNIQUE: Left digital diagnostic mammography and breast tomosynthesis was
performed. The images were evaluated with computer-aided detection.;
Targeted ultrasound examination of the left breast was performed

[Series 1: us breast*left* limited inc axilla · 0.06mm/px · 13 of 13 slices shown]
[im 1/13]
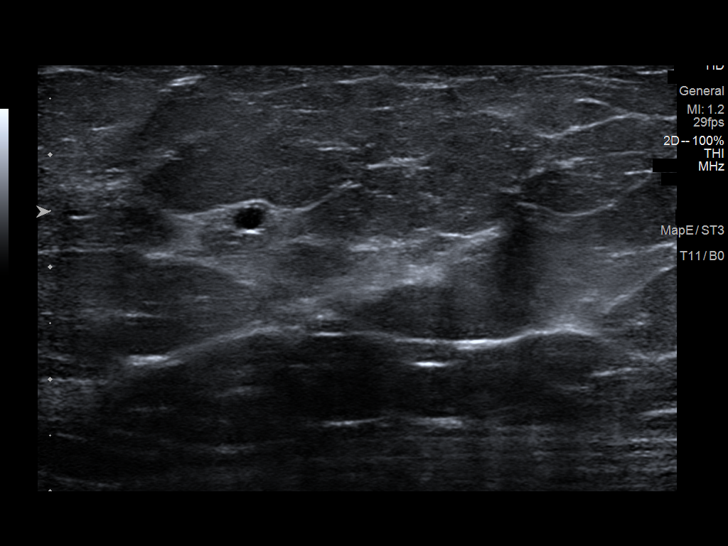
[im 2/13]
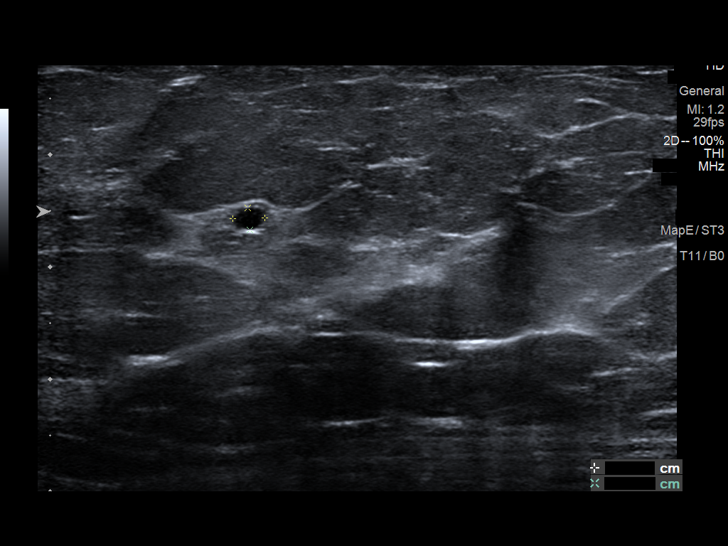
[im 3/13]
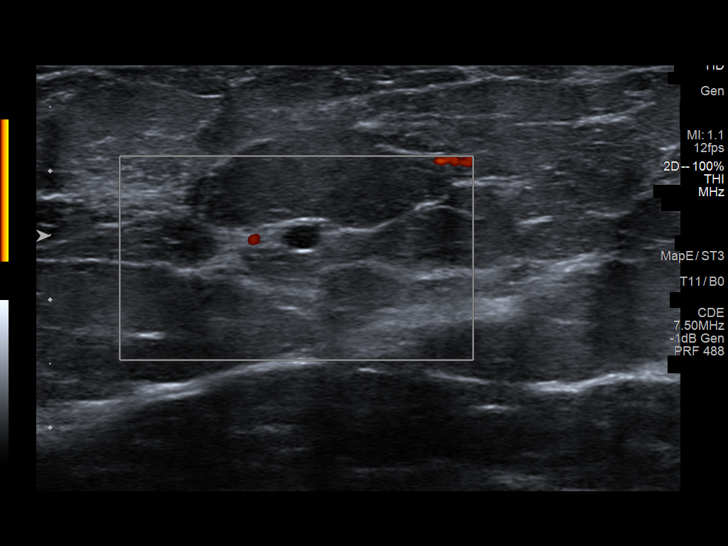
[im 4/13]
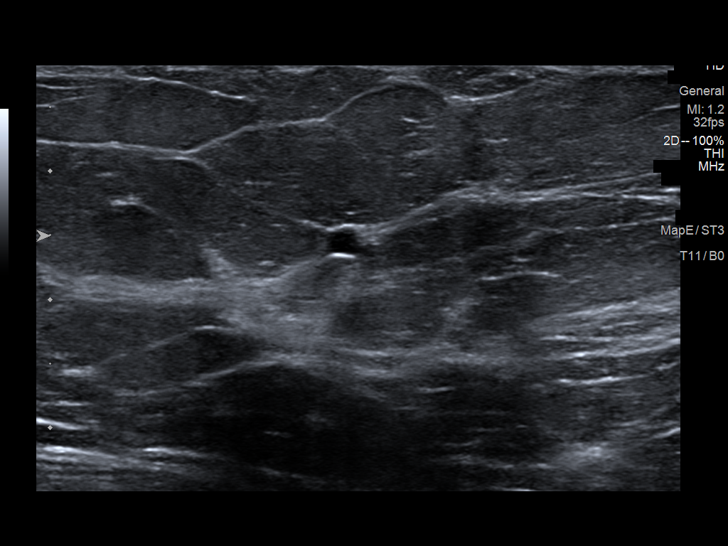
[im 5/13]
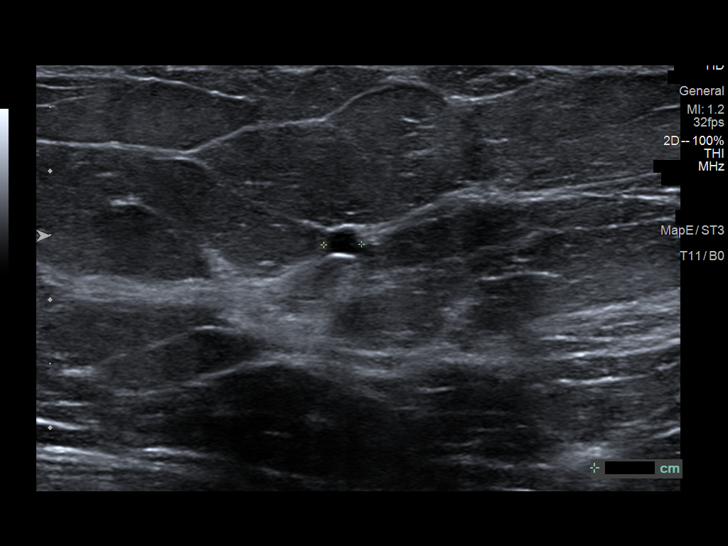
[im 6/13]
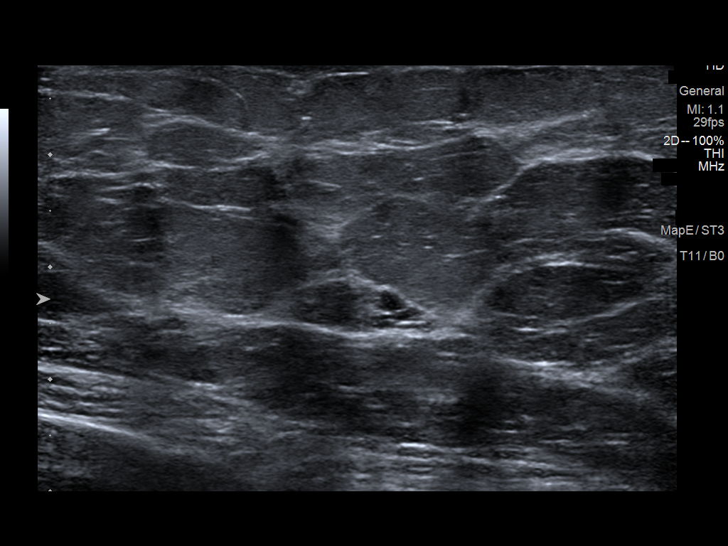
[im 7/13]
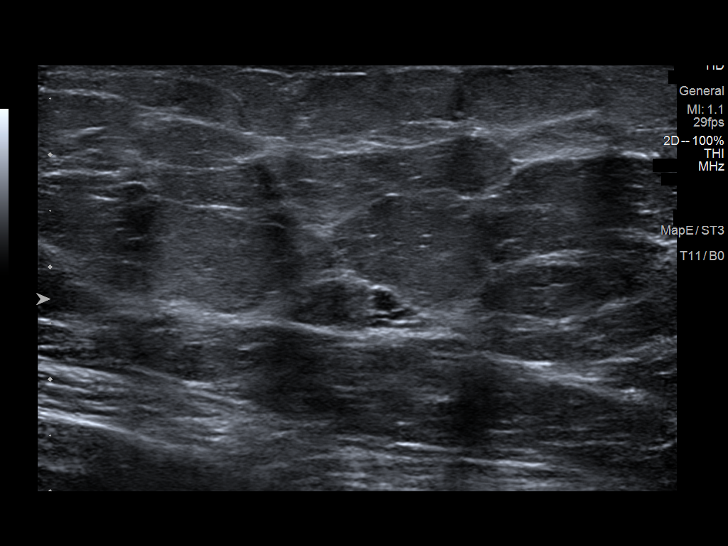
[im 8/13]
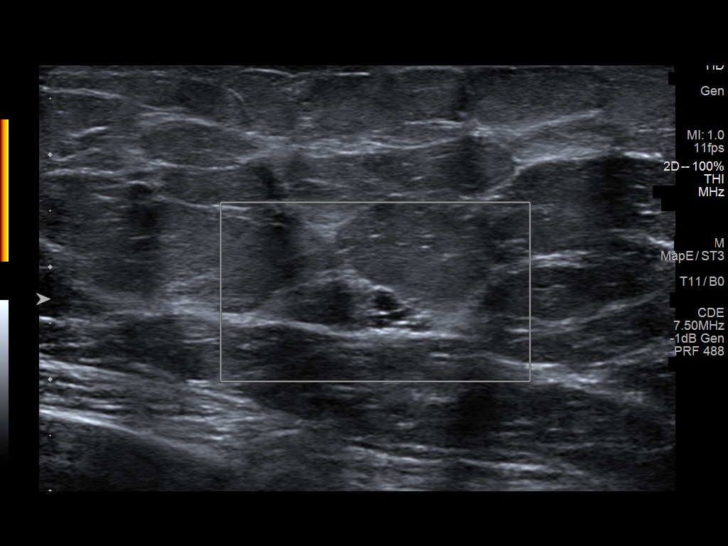
[im 9/13]
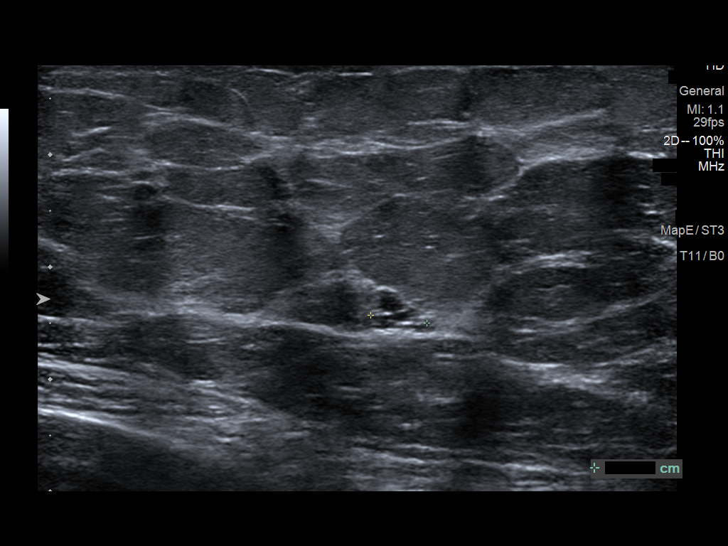
[im 10/13]
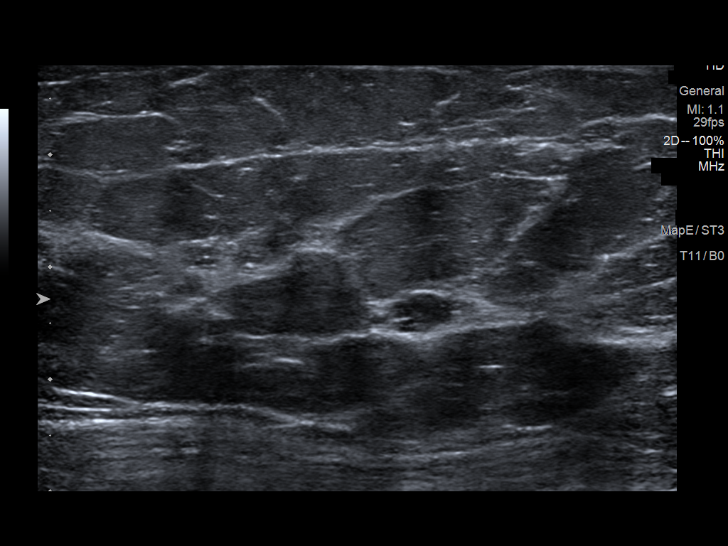
[im 11/13]
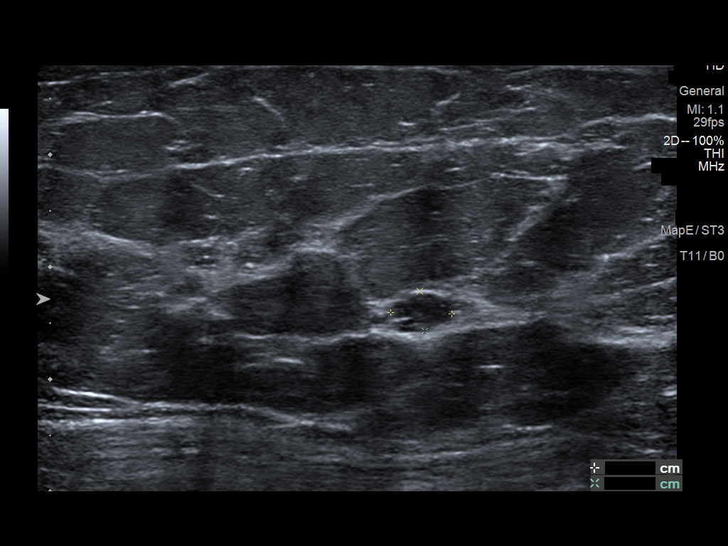
[im 12/13]
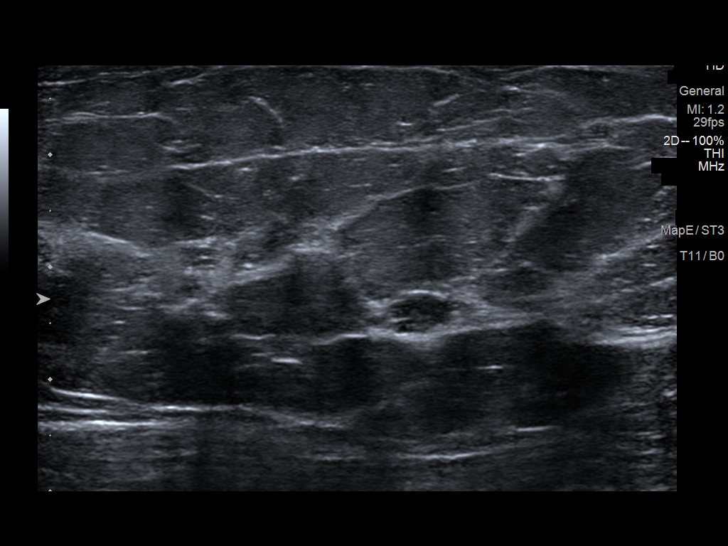
[im 13/13]
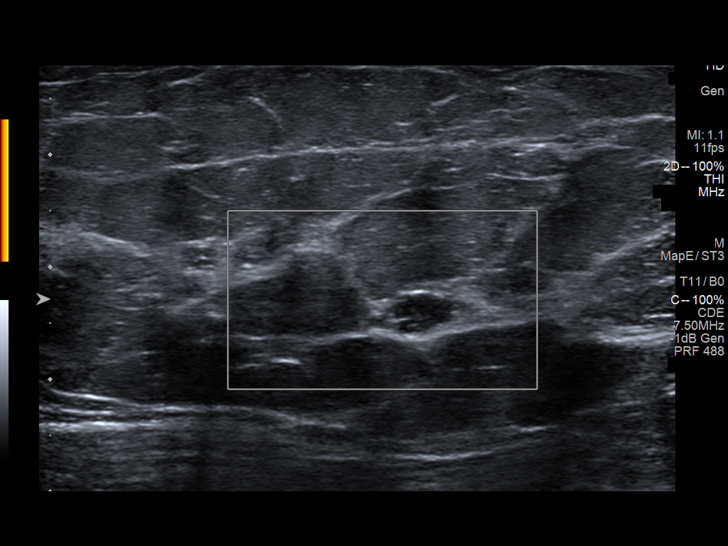

[13 of 13 positions shown; findings below may reference images not displayed]

ACR Breast Density Category b: There are scattered areas of
fibroglandular density.
FINDINGS: Mammogram:

Spot compression tomosynthesis MLO and full field mL tomosynthesis
views of the left breast were performed. There is persistence of an
oval circumscribed mass in the medial left breast measuring
approximately 0.5 cm.

Ultrasound:

Targeted ultrasound performed in the left breast at 9 o'clock 1 cm
from the nipple demonstrating an oval circumscribed anechoic mass
with thin septation, favored to represent a cluster of cysts. This
measures 0.5 x 0.4 x 0.5 cm and corresponds to the mammographic
finding. No internal vascularity. Incidentally noted at 10 o'clock 2
cm from nipple is a tiny benign simple cyst.
IMPRESSION: Probably benign mass in the left breast at 9 o'clock measuring
cm, likely a cluster of cysts.

RECOMMENDATION:
Diagnostic left breast mammogram and ultrasound in 6 months.

I have discussed the findings and recommendations with the patient
who agrees to proceed with short-term follow-up.

BI-RADS CATEGORY  3: Probably benign.

## 2022-11-08 ENCOUNTER — Other Ambulatory Visit: Payer: Self-pay | Admitting: Nurse Practitioner

## 2022-11-08 DIAGNOSIS — F411 Generalized anxiety disorder: Secondary | ICD-10-CM

## 2022-11-09 NOTE — Telephone Encounter (Signed)
Med refill request: fluoxetine 20 mg Last AEX: 11/27/21 Next AEX: 12/03/22 Last MMG (if hormonal med) n/a Refill authorized: fluoxetine 20 mg #30.  Sent to provider for review.

## 2022-12-03 ENCOUNTER — Ambulatory Visit: Payer: 59 | Admitting: Nurse Practitioner

## 2023-02-14 ENCOUNTER — Other Ambulatory Visit: Payer: Self-pay | Admitting: Nurse Practitioner

## 2023-02-14 DIAGNOSIS — F411 Generalized anxiety disorder: Secondary | ICD-10-CM

## 2023-02-15 NOTE — Telephone Encounter (Signed)
 Med refill request:Prozac  20 mg cap PO daily  Last AEX: 11/27/21 TW Next AEX: 03/04/23 -TW Last MMG (if hormonal med)- N/A   Refill authorized: Please Advise?

## 2023-03-04 ENCOUNTER — Encounter: Payer: Self-pay | Admitting: Nurse Practitioner

## 2023-03-04 ENCOUNTER — Other Ambulatory Visit (HOSPITAL_COMMUNITY)
Admission: RE | Admit: 2023-03-04 | Discharge: 2023-03-04 | Disposition: A | Discharge status: Home / Self Care | Payer: BLUE CROSS/BLUE SHIELD | Source: Ambulatory Visit | Attending: Nurse Practitioner | Admitting: Nurse Practitioner

## 2023-03-04 ENCOUNTER — Ambulatory Visit (INDEPENDENT_AMBULATORY_CARE_PROVIDER_SITE_OTHER): Payer: BLUE CROSS/BLUE SHIELD | Admitting: Nurse Practitioner

## 2023-03-04 VITALS — BP 124/88 | HR 95 | Ht 66.25 in | Wt 196.0 lb

## 2023-03-04 DIAGNOSIS — Z124 Encounter for screening for malignant neoplasm of cervix: Secondary | ICD-10-CM

## 2023-03-04 DIAGNOSIS — Z01419 Encounter for gynecological examination (general) (routine) without abnormal findings: Secondary | ICD-10-CM

## 2023-03-04 DIAGNOSIS — Z30431 Encounter for routine checking of intrauterine contraceptive device: Secondary | ICD-10-CM

## 2023-03-04 DIAGNOSIS — F411 Generalized anxiety disorder: Secondary | ICD-10-CM

## 2023-03-04 DIAGNOSIS — Z1331 Encounter for screening for depression: Secondary | ICD-10-CM | POA: Diagnosis not present

## 2023-03-04 MED ORDER — FLUOXETINE HCL 40 MG PO CAPS: 40.0000 mg | ORAL_CAPSULE | Freq: Every day | ORAL | 3 refills | Status: DC

## 2023-03-04 NOTE — Progress Notes (Signed)
 Jaime Klein 01/06/1976 [id number]   History:  48 y.o. G1P1 presents for annual exam. Mirena IUD placed 10/2018, monthly spotting. Normal pap history. Recently increased dose of Prozac due to situational anxiety relating to custody of niece. PHQ 9-6.   Gynecologic History No LMP recorded. (Menstrual status: IUD).   Contraception: IUD Sexually active: Yes  Health Maintenance Last Pap: 08/18/2018. Results were: Normal neg HPV Last mammogram: 02/26/2022. Results were: Normal Last colonoscopy: Never. Negative Cologuard 01/2022 Last Dexa: Not indicated  Past medical history, past surgical history, family history and social history were all reviewed and documented in the EPIC chart. 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   ROS:  A ROS was performed and pertinent positives and negatives are included.  Exam:  Vitals:   03/04/23 1011  BP: 124/88  Pulse: 95  SpO2: 98%  Weight: 196 lb (88.9 kg)  Height: 5' 6.25" (1.683 m)      Body mass index is 31.4 kg/m.  General appearance:  Normal Thyroid:  Symmetrical, normal in size, without palpable masses or nodularity. Respiratory  Auscultation:  Clear without wheezing or rhonchi Cardiovascular  Auscultation:  Regular rate, without rubs, murmurs or gallops  Edema/varicosities:  Not grossly evident Abdominal  Soft,nontender, without masses, guarding or rebound.  Liver/spleen:  No organomegaly noted  Hernia:  None appreciated  Skin  Inspection:  Small dry patches on lower legs   Breasts: Examined lying and sitting.   Right: Without masses, retractions, discharge or axillary adenopathy.   Left: Without masses, retractions, discharge or axillary adenopathy. Pelvic: External genitalia:  no lesions              Urethra:  normal appearing urethra with no masses, tenderness or lesions              Bartholins and Skenes: normal                 Vagina: normal appearing vagina with normal color and discharge, no lesions              Cervix: no  lesions. IUD string visible Bimanual Exam:  Uterus:  no masses or tenderness              Adnexa: no mass, fullness, tenderness              Rectovaginal: Deferred              Anus:  normal, no lesions  Patient informed chaperone available to be present for breast and pelvic exam. Patient has requested no chaperone to be present. Patient has been advised what will be completed during breast and pelvic exam.    Assessment/Plan:  48 y.o. G1P1 for annual exam.   Well female exam with routine gynecological exam - Plan: CBC with Differential/Platelet, Comprehensive metabolic panel. Education provided on SBEs, importance of preventative screenings, current guidelines, high calcium diet, regular exercise, and multivitamin daily. Not fasting, normal lipid panels last year.   Encounter for routine checking of intrauterine contraceptive device (IUD) - Mirena IUD 10/2018. Light monthly spotting. Aware of 8-year FDA approval.   Cervical cancer screening - Plan: Cytology - PAP( [facility]). Normal pap history.   GAD (generalized anxiety disorder) - Plan: FLUoxetine (PROZAC) 40 MG capsule daily. Increased dose to 40 mg due to personal stressors. Doing well on this.   Screening for colon cancer - 01/2022 negative Cologuard  Screening for breast cancer - Continue annual screenings. Normal breast exam today.  Return in about  1 year (around 03/03/2024) for Annual.        [name] [facility], 11:23 AM 03/04/2023

## 2023-03-05 LAB — CBC WITH DIFFERENTIAL/PLATELET
Absolute Lymphocytes: 1972 {cells}/uL (ref 850–3900)
Absolute Monocytes: 434 {cells}/uL (ref 200–950)
Basophils Absolute: 19 {cells}/uL (ref 0–200)
Basophils Relative: 0.3 %
Eosinophils Absolute: 130 {cells}/uL (ref 15–500)
Eosinophils Relative: 2.1 %
HCT: 40.5 % (ref 35.0–45.0)
Hemoglobin: 13.9 g/dL (ref 11.7–15.5)
MCH: 29.8 pg (ref 27.0–33.0)
MCHC: 34.3 g/dL (ref 32.0–36.0)
MCV: 86.7 fL (ref 80.0–100.0)
MPV: 9.2 fL (ref 7.5–12.5)
Monocytes Relative: 7 %
Neutro Abs: 3646 {cells}/uL (ref 1500–7800)
Neutrophils Relative %: 58.8 %
Platelets: 340 10*3/uL (ref 140–400)
RBC: 4.67 10*6/uL (ref 3.80–5.10)
RDW: 12.2 % (ref 11.0–15.0)
Total Lymphocyte: 31.8 %
WBC: 6.2 10*3/uL (ref 3.8–10.8)

## 2023-03-05 LAB — COMPREHENSIVE METABOLIC PANEL
AG Ratio: 1.5 (calc) (ref 1.0–2.5)
ALT: 12 U/L (ref 6–29)
AST: 14 U/L (ref 10–35)
Albumin: 4.3 g/dL (ref 3.6–5.1)
Alkaline phosphatase (APISO): 69 U/L (ref 31–125)
BUN: 12 mg/dL (ref 7–25)
CO2: 25 mmol/L (ref 20–32)
Calcium: 9.2 mg/dL (ref 8.6–10.2)
Chloride: 102 mmol/L (ref 98–110)
Creat: 0.69 mg/dL (ref 0.50–0.99)
Globulin: 2.8 g/dL (ref 1.9–3.7)
Glucose, Bld: 92 mg/dL (ref 65–99)
Potassium: 4.3 mmol/L (ref 3.5–5.3)
Sodium: 139 mmol/L (ref 135–146)
Total Bilirubin: 0.3 mg/dL (ref 0.2–1.2)
Total Protein: 7.1 g/dL (ref 6.1–8.1)

## 2023-03-05 LAB — CYTOLOGY - PAP
Comment: NEGATIVE
Diagnosis: NEGATIVE
High risk HPV: NEGATIVE

## 2023-03-06 ENCOUNTER — Encounter: Payer: Self-pay | Admitting: Nurse Practitioner

## 2023-03-27 ENCOUNTER — Encounter: Payer: Self-pay | Admitting: Nurse Practitioner

## 2023-05-23 ENCOUNTER — Encounter: Payer: Self-pay | Admitting: Nurse Practitioner

## 2024-02-12 ENCOUNTER — Other Ambulatory Visit: Payer: Self-pay | Admitting: Nurse Practitioner

## 2024-02-12 DIAGNOSIS — F411 Generalized anxiety disorder: Secondary | ICD-10-CM

## 2024-02-13 NOTE — Telephone Encounter (Signed)
.  Med refill request: Prozac  Last AEX: 03/04/23 Next AEX: 03/30/24 Last MMG (if hormonal med) NA Refill authorized: Please Advise?

## 2024-03-09 ENCOUNTER — Ambulatory Visit: Payer: BLUE CROSS/BLUE SHIELD | Admitting: Nurse Practitioner

## 2024-03-30 ENCOUNTER — Ambulatory Visit: Admitting: Nurse Practitioner
